# Patient Record
Sex: Female | Born: 1960
Health system: Southern US, Community
[De-identification: ages and names within clinical notes are randomized; demographics above are authoritative.]

## PROBLEM LIST (undated history)

## (undated) DIAGNOSIS — N952 Postmenopausal atrophic vaginitis: Secondary | ICD-10-CM

## (undated) DIAGNOSIS — N950 Postmenopausal bleeding: Secondary | ICD-10-CM

## (undated) DIAGNOSIS — I10 Essential (primary) hypertension: Secondary | ICD-10-CM

## (undated) DIAGNOSIS — G43909 Migraine, unspecified, not intractable, without status migrainosus: Secondary | ICD-10-CM

## (undated) DIAGNOSIS — T7840XA Allergy, unspecified, initial encounter: Secondary | ICD-10-CM

## (undated) HISTORY — DX: Postmenopausal bleeding: N95.0

## (undated) HISTORY — PX: BREAST EXCISIONAL BIOPSY: SUR124

## (undated) HISTORY — PX: TUBAL LIGATION: SHX77

## (undated) HISTORY — DX: Essential (primary) hypertension: I10

## (undated) HISTORY — DX: Postmenopausal atrophic vaginitis: N95.2

## (undated) HISTORY — DX: Migraine, unspecified, not intractable, without status migrainosus: G43.909

## (undated) HISTORY — DX: Allergy, unspecified, initial encounter: T78.40XA

## (undated) HISTORY — PX: LASIK: SHX215

---

## 1985-01-07 HISTORY — PX: BREAST BIOPSY: SHX20

## 2009-11-07 LAB — HM COLONOSCOPY

## 2012-01-08 DIAGNOSIS — N950 Postmenopausal bleeding: Secondary | ICD-10-CM

## 2012-01-08 HISTORY — PX: DILATION AND CURETTAGE OF UTERUS: SHX78

## 2012-01-08 HISTORY — DX: Postmenopausal bleeding: N95.0

## 2014-03-04 LAB — HM MAMMOGRAPHY: HM MAMMO: NORMAL (ref 0–4)

## 2014-05-20 LAB — HM PAP SMEAR: HM PAP: NORMAL

## 2014-08-25 ENCOUNTER — Ambulatory Visit (INDEPENDENT_AMBULATORY_CARE_PROVIDER_SITE_OTHER): Payer: BLUE CROSS/BLUE SHIELD | Admitting: Primary Care

## 2014-08-25 ENCOUNTER — Encounter: Payer: Self-pay | Admitting: Primary Care

## 2014-08-25 VITALS — BP 124/74 | HR 59 | Temp 97.5°F | Ht 66.0 in | Wt 214.8 lb

## 2014-08-25 DIAGNOSIS — G43109 Migraine with aura, not intractable, without status migrainosus: Secondary | ICD-10-CM | POA: Diagnosis not present

## 2014-08-25 DIAGNOSIS — I10 Essential (primary) hypertension: Secondary | ICD-10-CM

## 2014-08-25 DIAGNOSIS — M722 Plantar fascial fibromatosis: Secondary | ICD-10-CM | POA: Diagnosis not present

## 2014-08-25 DIAGNOSIS — G43909 Migraine, unspecified, not intractable, without status migrainosus: Secondary | ICD-10-CM | POA: Insufficient documentation

## 2014-08-25 NOTE — Assessment & Plan Note (Signed)
Improved over the years. Aura with seeing black spots and left arm numbness. Controlled with ibuprofen 800 mg. Will continue to monitor

## 2014-08-25 NOTE — Progress Notes (Signed)
Pre visit review using our clinic review tool, if applicable. No additional management support is needed unless otherwise documented below in the visit note. 

## 2014-08-25 NOTE — Assessment & Plan Note (Signed)
Managed on atenolol 75 mg from prior PCP. Not taken HCTZ for 2 years. BP stable today. No symptoms of chest pain, headaches, shortness of breath. Follow up in 6 months for re-evaluation.

## 2014-08-25 NOTE — Patient Instructions (Signed)
It is important that you improve your diet. Please limit carbohydrates in the form of white bread, rice, pasta, cakes, cookies, sugary drinks, etc. Increase your consumption of fresh fruits and vegetables. Be sure to drink plenty of water daily.  Start exercising if possible. You should be getting about 45 minutes of moderate intensity exercise 4-5 days weekly.  Send me a message on My Chart when you need refills.  Follow up in 6 months for re-evaluation of blood pressure.  It was a pleasure to see you today! Please don't hesitate to call me with any questions. Welcome to [facility]!

## 2014-08-25 NOTE — Assessment & Plan Note (Signed)
Long standing history. Discussed comfortable shoes, insoles. Will continue to monitor.

## 2014-08-25 NOTE — Progress Notes (Signed)
Subjective:    Patient ID: Meagan Johnson, female    DOB: [birth year], 54 y.o.   MRN: [id number]  HPI  Meagan Johnson is a 54 year old female who presents today to establish care and discuss the problems mentioned below. Will review old records. Last physical was May 2016. She had mammogram and pap completed in May 2016 with basic lab work. Lab work unremarkable.  1) Essential hypertension: Currently managed on HCTZ 12.5 mg and atenolol 75 mg but has not taken her HCTZ for over 2 years.  Her BP is stable in the office today. Denies headaches, chest pain, shortness of breath, dizziness, blurred vision.  2) Foot pain: History of plantar faciatitis. Some days she will have difficulty standing on her feet after waking in the morning. She will roll her feet with a bar at home. She does not have insoles in her shoes and plans to purchase new pair of tennis shoes.   3) Migraines: Once occurred often, not currently bothersome. Will now get an aura of seeing spots and left arm numbness, but does not experience terrible pain. She will take an ibuprofen 800 mg which will control her headache.  Review of Systems  Constitutional: Negative for unexpected weight change.  HENT: Negative for rhinorrhea.   Respiratory: Negative for cough and shortness of breath.   Cardiovascular: Negative for chest pain.  Gastrointestinal: Negative for diarrhea and constipation.  Genitourinary: Negative for difficulty urinating.  Musculoskeletal:       Chronic low back pain  Skin: Negative for rash.  Allergic/Immunologic: Positive for environmental allergies.  Neurological: Negative for dizziness, numbness and headaches.  Psychiatric/Behavioral:       Denies concerns for anxiety or depression       Past Medical History  Diagnosis Date  . Allergy   . Hypertension   . Migraine     Social History   Social History  . Marital Status: Married    Spouse Name: N/A  . Number of Children: N/A  . Years of Education:  N/A   Occupational History  . Not on file.   Social History Main Topics  . Smoking status: Never Smoker   . Smokeless tobacco: Not on file  . Alcohol Use: 0.0 oz/week    0 Standard drinks or equivalent per week  . Drug Use: Not on file  . Sexual Activity: Not on file   Other Topics Concern  . Not on file   Social History Narrative  . No narrative on file    Past Surgical History  Procedure Laterality Date  . Breast biopsy Left 1987    Family History  Problem Relation Age of Onset  . Alcohol abuse Mother   . Diabetes Mother   . Alcohol abuse Father   . Diabetes Father   . Diabetes Sister   . Drug abuse Brother   . Hypertension Brother   . Diabetes Brother   . Heart disease Maternal Grandmother   . Diabetes Maternal Grandmother   . Heart disease Maternal Grandfather   . Diabetes Maternal Grandfather   . Heart disease Paternal Grandmother   . Diabetes Paternal Grandmother   . Heart disease Paternal Grandfather   . Diabetes Paternal Grandfather     No Known Allergies  No current outpatient prescriptions on file prior to visit.   No current facility-administered medications on file prior to visit.    BP 124/74 mmHg  Pulse 59  Temp(Src) 97.5 F (36.4 C) (Oral)  Ht 5\' 6"  (1.676  m)  Wt 214 lb 12.8 oz (97.433 kg)  BMI 34.69 kg/m2  SpO2 95%    Objective:   Physical Exam  Constitutional: She is oriented to person, place, and time. She appears well-nourished.  Cardiovascular: Normal rate and regular rhythm.   Pulmonary/Chest: Effort normal and breath sounds normal.  Neurological: She is alert and oriented to person, place, and time.  Skin: Skin is warm and dry.          Assessment & Plan:

## 2014-09-04 ENCOUNTER — Encounter: Payer: Self-pay | Admitting: Primary Care

## 2014-09-04 ENCOUNTER — Other Ambulatory Visit: Payer: Self-pay | Admitting: Primary Care

## 2014-09-04 DIAGNOSIS — M722 Plantar fascial fibromatosis: Secondary | ICD-10-CM

## 2014-09-04 DIAGNOSIS — I10 Essential (primary) hypertension: Secondary | ICD-10-CM

## 2014-09-04 MED ORDER — IBUPROFEN 800 MG PO TABS: 800.0000 mg | ORAL_TABLET | Freq: Two times a day (BID) | ORAL | Status: DC | PRN

## 2014-09-04 MED ORDER — ATENOLOL 25 MG PO TABS: 75.0000 mg | ORAL_TABLET | Freq: Every day | ORAL | Status: DC

## 2014-09-08 ENCOUNTER — Ambulatory Visit (INDEPENDENT_AMBULATORY_CARE_PROVIDER_SITE_OTHER): Payer: BLUE CROSS/BLUE SHIELD | Admitting: Family Medicine

## 2014-09-08 ENCOUNTER — Encounter: Payer: Self-pay | Admitting: Family Medicine

## 2014-09-08 VITALS — BP 138/82 | HR 57 | Temp 97.8°F | Ht 66.0 in | Wt 211.2 lb

## 2014-09-08 DIAGNOSIS — M722 Plantar fascial fibromatosis: Secondary | ICD-10-CM | POA: Diagnosis not present

## 2014-09-08 NOTE — Progress Notes (Signed)
Pre visit review using our clinic review tool, if applicable. No additional management support is needed unless otherwise documented below in the visit note. 

## 2014-09-08 NOTE — Patient Instructions (Signed)
Please read handouts on Plantar Fascitis.  STRETCHING and Strengthening program critically important.  Strengthening on foot and calf muscles as seen in handout. Calf raises, 2 legged, then 1 legged. Foot massage with tennis ball. Ice massage.  NEEDS TO BE DONE EVERY DAY  Recommended over the counter insoles. (Spenco or Hapad)  A rigid shoe with good arch support helps: Dansko (great), Keen, Merrell No easily bendable shoes.    Excellent Over the Counter Orthotics:  Hapad: available at www.hapad.com (Green Sports Insoles)  SPENCO: Available at some sports stores or www.amazon.com. (I prefer the full-length green orthotic that has a yellow bottom / base)  www.pedifix.com and "[employer] [facility]" website: multiple good foot products  SHOES: Danskos, Merrells, Keens, Clarks, Birkenstocks - good arch support, want minimal bendability. Finn Comfort also excellent, but even more expensive.  Tennis Shoes / Running Shoes: Many good companies and styles. The most important thing is to get a good fit and wear a shoe that feels good in the store. Walk around in the store. Run if that is something that you can do. Some of the running stores have a treadmill, also.  Brooks, New Balance, Saucony are good, but the most important thing is to wear a shoe that fits your foot and feels good.  [facility] [facility] in [place]: excellent staff, shoe selection (Running and Athletic Shoes), OTC orthotics. On [address] across from the [employer]. For running shoe and athletic shoe fit, they are the best.  Nicer shoes:  Birkenstock shoes, [place], [place]: Excellent selection  [employer], [address] [place], [place]: They have the largest selection of comfort and supportive shoes that I have ever seen, and their staff is generally quite good in fitting you for shoes. (They will intermittently have sales, mail coupons, and you can look on their website.)  [employer] [facility]: [address], [place], [place]    

## 2014-09-08 NOTE — Progress Notes (Signed)
Dr. Karleen Hampshire T. Lynnelle Mesmer, MD, CAQ Sports Medicine [facility] [address] [place], [address] Phone: [phone] Fax: [phone]  09/08/2014  Patient: Meagan Johnson, MRN: [id number], DOB: 1960/04/09, 54 y.o.  Primary Physician:  Morrie Sheldon, NP  Chief Complaint: plantar facsiitis  Subjective:   This 54 y.o. female patient presents with a 1 year long history of heel pain. This is notable for worsening pain first thing in the morning when arising and standing after sitting.   Has had plantar fascitis for more than a year. Left is the worst. Cannot stand. Will go onto its tiptoes.   Roller Heel cups  Prior foot or ankle fractures: none Prior operations: none Orthotics or bracing: few otc braces and heel cups Medications: nsaids help PT or home rehab: HEP Night splints: minimal help Ice massage: no Ball massage: +  Metatarsal pain: no  The PMH, PSH, Social History, Family History, Medications, and allergies have been reviewed in [facility], and have been updated if relevant.  Patient Active Problem List   Diagnosis Date Noted  . Essential hypertension 08/25/2014  . Migraines 08/25/2014  . Plantar fasciitis 08/25/2014    Past Medical History  Diagnosis Date  . Allergy   . Hypertension   . Migraine     Past Surgical History  Procedure Laterality Date  . Breast biopsy Left 1987    Social History   Social History  . Marital Status: Married    Spouse Name: N/A  . Number of Children: N/A  . Years of Education: N/A   Occupational History  . Not on file.   Social History Main Topics  . Smoking status: Never Smoker   . Smokeless tobacco: Never Used  . Alcohol Use: 0.0 oz/week    0 Standard drinks or equivalent per week  . Drug Use: No  . Sexual Activity: Not on file   Other Topics Concern  . Not on file   Social History Narrative    Family History  Problem Relation Age of Onset  . Alcohol abuse Mother   .  Diabetes Mother   . Alcohol abuse Father   . Diabetes Father   . Diabetes Sister   . Drug abuse Brother   . Hypertension Brother   . Diabetes Brother   . Heart disease Maternal Grandmother   . Diabetes Maternal Grandmother   . Heart disease Maternal Grandfather   . Diabetes Maternal Grandfather   . Heart disease Paternal Grandmother   . Diabetes Paternal Grandmother   . Heart disease Paternal Grandfather   . Diabetes Paternal Grandfather     No Known Allergies  Medication list reviewed and updated in full in [facility].  GEN: No fevers, chills. Nontoxic. Primarily MSK c/o today. MSK: Detailed in the HPI GI: tolerating PO intake without difficulty Neuro: No numbness, parasthesias, or tingling associated. Otherwise the pertinent positives of the ROS are noted above.   Objective:   Blood pressure 138/82, pulse 57, temperature 97.8 F (36.6 C), temperature source Oral, height 5\' 6"  (1.676 m), weight 211 lb 4 oz (95.822 kg).  GEN: Well-developed,well-nourished,in no acute distress; alert,appropriate and cooperative throughout examination HEENT: Normocephalic and atraumatic without obvious abnormalities. Ears, externally no deformities PULM: Breathing comfortably in no respiratory distress EXT: No clubbing, cyanosis, or edema PSYCH: Normally interactive. Cooperative during the interview. Pleasant. Friendly and conversant. Not anxious or depressed appearing. Normal, full affect.  B feet Echymosis: no Edema: no ROM: full LE B Gait: heel  toe, non-antalgic MT pain: no Callus pattern: none Lateral Mall: NT Medial Mall: NT Talus: NT Navicular: NT Calcaneous: NT Metatarsals: NT 5th MT: NT Phalanges: NT Achilles: NT Plantar Fascia: tender, medial along PF. Pain with forced dorsi Fat Pad: NT Peroneals: NT Post Tib: NT Great Toe: Nml motion Ant Drawer: neg Other foot breakdown: none Long arch: preserved Transverse arch: preserved Hindfoot breakdown:  none Sensation: intact  Assessment and Plan:   Plantar fasciitis  >25 minutes spent in face to face time with patient, >50% spent in counselling or coordination of care  Anatomy reviewed. Stretching and rehab are critically important to the treatment of PF. Reviewed footwear. Rigid soles have been shown to help with PF.  Reviewed rehab of stretching and calf raises.  Reviewed rehab from [employer]  Could benefit from a corticosteroid injection if conservative treatment fails.  Add arch binders Custom crafted poron heel cushions / heel cups Discussed footwear  Refer to the patient instructions sections for details of plan shared with patient.   Patient Instructions  Please read handouts on Plantar Fascitis.  STRETCHING and Strengthening program critically important.  Strengthening on foot and calf muscles as seen in handout. Calf raises, 2 legged, then 1 legged. Foot massage with tennis ball. Ice massage.  NEEDS TO BE DONE EVERY DAY  Recommended over the counter insoles. (Spenco or Hapad)  A rigid shoe with good arch support helps: Dansko (great), Randel Pigg, Merrell No easily bendable shoes.    ____________________________________________________________________________________  Excellent Over the Counter Orthotics:  Hapad: available at www.hapad.com (Green Sports Insoles)  SPENCO: Available at some sports stores or GoalForum.com.au. (I prefer the full-length green orthotic that has a yellow bottom / base)  www.pedifix.com and [employer]" website: multiple good foot products  SHOES: Danskos, Merrells, Keens, Clarks, Birkenstocks - good arch support, want minimal bendability. Finn Comfort also excellent, but even more expensive.  Tennis Shoes / Running Shoes: Many good companies and styles. The most important thing is to get a good fit and wear a shoe that feels good in the store. Walk around in the store. Run if that is something  that you can do. Some of the running stores have a treadmill, also.  Brooks, New Balance, Andrena Mews are good, but the most important thing is to wear a shoe that fits your foot and feels good.  [facility] [facility] in [place]: excellent staff, Armed forces technical officer (Running and [employer]), OTC orthotics. On [address] across from the [employer]. For running shoe and athletic shoe fit, they are the best.  Nicer shoes:  Birkenstock shoes, [employer], [place]: Excellent selection  [facility], [address] [place], [place]: They have the largest selection of comfort and supportive shoes that I have ever seen, and their staff is generally quite good in fitting you for shoes. (They will intermittently have sales, mail coupons, and you can look on their website.)  [facility]: [address] [place], [place]       Signed,  [place] T. Kaidence Callaway, MD   Patient's Medications  New Prescriptions   No medications on file  Previous Medications   ATENOLOL (TENORMIN) 25 MG TABLET    Take 3 tablets (75 mg total) by mouth daily.   IBUPROFEN (ADVIL,MOTRIN) 800 MG TABLET    Take 1 tablet (800 mg total) by mouth 2 (two) times daily as needed for moderate pain.  Modified Medications   No medications on file  Discontinued  Medications   HYDROCHLOROTHIAZIDE (HYDRODIURIL) 25 MG TABLET    Take 25 mg by mouth daily.

## 2014-10-21 ENCOUNTER — Other Ambulatory Visit: Payer: Self-pay | Admitting: Primary Care

## 2014-10-24 NOTE — Telephone Encounter (Signed)
Electronically refill request for   ibuprofen (ADVIL,MOTRIN) 800 MG tablet   Take 1 tablet (800 mg total) by mouth 2 (two) times daily as needed for moderate pain.  Dispense: 60 tablet   Refills: 1     Last prescribed on  09/04/2014. Last seen on 08/25/2014. No future appointment.

## 2014-12-16 ENCOUNTER — Other Ambulatory Visit: Payer: Self-pay | Admitting: Primary Care

## 2014-12-16 DIAGNOSIS — I10 Essential (primary) hypertension: Secondary | ICD-10-CM

## 2014-12-16 NOTE — Telephone Encounter (Signed)
Electronically refill request for   atenolol (TENORMIN) 25 MG tablet   Take 3 tablets (75 mg total) by mouth daily.  Dispense: 90 tablet   Refills: 3     Last prescribed on 09/04/2014. Last seen on 08/25/2014. No future appointment.

## 2015-03-06 ENCOUNTER — Encounter: Payer: Self-pay | Admitting: Primary Care

## 2015-03-07 ENCOUNTER — Other Ambulatory Visit: Payer: Self-pay | Admitting: Primary Care

## 2015-03-07 DIAGNOSIS — R102 Pelvic and perineal pain: Secondary | ICD-10-CM

## 2015-03-08 ENCOUNTER — Other Ambulatory Visit: Payer: Self-pay | Admitting: Primary Care

## 2015-03-08 ENCOUNTER — Ambulatory Visit (INDEPENDENT_AMBULATORY_CARE_PROVIDER_SITE_OTHER): Payer: BLUE CROSS/BLUE SHIELD | Admitting: Obstetrics and Gynecology

## 2015-03-08 ENCOUNTER — Encounter: Payer: Self-pay | Admitting: Obstetrics and Gynecology

## 2015-03-08 VITALS — BP 114/70 | HR 79 | Ht 66.0 in | Wt 210.8 lb

## 2015-03-08 DIAGNOSIS — N3289 Other specified disorders of bladder: Secondary | ICD-10-CM

## 2015-03-08 DIAGNOSIS — R319 Hematuria, unspecified: Secondary | ICD-10-CM

## 2015-03-08 DIAGNOSIS — N8110 Cystocele, unspecified: Secondary | ICD-10-CM | POA: Diagnosis not present

## 2015-03-08 DIAGNOSIS — R638 Other symptoms and signs concerning food and fluid intake: Secondary | ICD-10-CM | POA: Insufficient documentation

## 2015-03-08 DIAGNOSIS — N952 Postmenopausal atrophic vaginitis: Secondary | ICD-10-CM | POA: Insufficient documentation

## 2015-03-08 DIAGNOSIS — R350 Frequency of micturition: Secondary | ICD-10-CM | POA: Diagnosis not present

## 2015-03-08 DIAGNOSIS — N390 Urinary tract infection, site not specified: Secondary | ICD-10-CM | POA: Insufficient documentation

## 2015-03-08 DIAGNOSIS — R102 Pelvic and perineal pain: Secondary | ICD-10-CM

## 2015-03-08 DIAGNOSIS — IMO0002 Reserved for concepts with insufficient information to code with codable children: Secondary | ICD-10-CM | POA: Insufficient documentation

## 2015-03-08 LAB — POCT URINALYSIS DIPSTICK
Bilirubin, UA: NEGATIVE
Glucose, UA: NEGATIVE
Ketones, UA: NEGATIVE
NITRITE UA: NEGATIVE
PH UA: 6
PROTEIN UA: NEGATIVE
Spec Grav, UA: 1.01
Urobilinogen, UA: 0.2

## 2015-03-08 MED ORDER — OXYBUTYNIN CHLORIDE 5 MG PO TABS: 5.0000 mg | ORAL_TABLET | Freq: Two times a day (BID) | ORAL | Status: DC

## 2015-03-08 MED ORDER — ESTRADIOL 0.1 MG/GM VA CREA: 1.0000 | TOPICAL_CREAM | Freq: Every day | VAGINAL | Status: DC

## 2015-03-08 NOTE — Patient Instructions (Signed)
1. Use estrogen cream 1/2 g nightly for 30 days; then twice a week 2. Start oxybutynin 5 mg twice a day for unstable bladder symptoms 3. Kegel exercises may be done to help prevent stress incontinence 4. Return in 6 weeks for follow-up
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
## 2015-03-09 ENCOUNTER — Emergency Department
Admission: EM | Admit: 2015-03-09 | Discharge: 2015-03-09 | Disposition: A | Discharge status: Home / Self Care | Payer: BLUE CROSS/BLUE SHIELD | Attending: Emergency Medicine | Admitting: Emergency Medicine

## 2015-03-09 DIAGNOSIS — I10 Essential (primary) hypertension: Secondary | ICD-10-CM | POA: Diagnosis not present

## 2015-03-09 DIAGNOSIS — Z79899 Other long term (current) drug therapy: Secondary | ICD-10-CM | POA: Diagnosis not present

## 2015-03-09 DIAGNOSIS — J0101 Acute recurrent maxillary sinusitis: Secondary | ICD-10-CM | POA: Insufficient documentation

## 2015-03-09 DIAGNOSIS — R05 Cough: Secondary | ICD-10-CM | POA: Diagnosis present

## 2015-03-09 MED ORDER — CETIRIZINE HCL 10 MG PO TABS: 10.0000 mg | ORAL_TABLET | Freq: Every day | ORAL | Status: DC

## 2015-03-09 MED ORDER — AMOXICILLIN-POT CLAVULANATE 875-125 MG PO TABS: 1.0000 | ORAL_TABLET | Freq: Two times a day (BID) | ORAL | Status: DC

## 2015-03-09 MED ORDER — FLUTICASONE PROPIONATE 50 MCG/ACT NA SUSP: 1.0000 | Freq: Two times a day (BID) | NASAL | Status: DC

## 2015-03-09 NOTE — ED Notes (Signed)
Pt in with cough, congestion, and earaches for 1 week.

## 2015-03-09 NOTE — ED Provider Notes (Signed)
[facility] Emergency Department Provider Note  ____________________________________________  Time seen: Approximately 11:07 PM  I have reviewed the triage vital signs and the nursing notes.   HISTORY  Chief Complaint Cough    HPI Meagan Johnson is a 55 y.o. female who presents to emergency department complaining of nasal congestion, sinus pressure, earaches, and cough 1 week. Patient states that symptoms drastically increased over the last 24 hours. Patient has a history of allergic rhinitis and states that she has frequent sinus infections. She states that this feels like a sinus infection. Patient denies any fevers or chills, headache, visual acuity changes, neck pain, chest pain, shortness of breath, abdominal pain, nausea or vomiting.   Past Medical History  Diagnosis Date  . Allergy   . Hypertension   . Migraine     Patient Active Problem List   Diagnosis Date Noted  . Cystocele 03/08/2015  . Increased BMI 03/08/2015  . Vaginal atrophy 03/08/2015  . Urinary frequency 03/08/2015  . Unstable bladder 03/08/2015  . Essential hypertension 08/25/2014  . Migraines 08/25/2014  . Plantar fasciitis 08/25/2014    Past Surgical History  Procedure Laterality Date  . Breast biopsy Left 1987  . Dilation and curettage of uterus      Current Outpatient Rx  Name  Route  Sig  Dispense  Refill  . amoxicillin-clavulanate (AUGMENTIN) 875-125 MG tablet   Oral   Take 1 tablet by mouth 2 (two) times daily.   14 tablet   0   . atenolol (TENORMIN) 25 MG tablet      TAKE 3 TABLETS (75 MG TOTAL) BY MOUTH DAILY.   90 tablet   5     CYCLE FILL MEDICATION. Authorization is required f ...   . cetirizine (ZYRTEC) 10 MG tablet   Oral   Take 1 tablet (10 mg total) by mouth daily.   30 tablet   0   . estradiol (ESTRACE VAGINAL) 0.1 MG/GM vaginal cream   Vaginal   Place 1 Applicatorful vaginally at bedtime.   42.5 g   12   . fluticasone (FLONASE) 50  MCG/ACT nasal spray   Each Nare   Place 1 spray into both nostrils 2 (two) times daily.   16 g   0   . ibuprofen (ADVIL,MOTRIN) 800 MG tablet      TAKE 1 TABLET (800 MG TOTAL) BY MOUTH 2 (TWO) TIMES DAILY AS NEEDED FOR MODERATE PAIN.   60 tablet   0     No refills available   . oxybutynin (DITROPAN) 5 MG tablet   Oral   Take 1 tablet (5 mg total) by mouth 2 (two) times daily.   60 tablet   6     Allergies Review of patient's allergies indicates no known allergies.  Family History  Problem Relation Age of Onset  . Alcohol abuse Mother   . Diabetes Mother   . Alcohol abuse Father   . Diabetes Father   . Diabetes Sister   . Drug abuse Brother   . Hypertension Brother   . Diabetes Brother   . Heart disease Maternal Grandmother   . Diabetes Maternal Grandmother   . Heart disease Maternal Grandfather   . Diabetes Maternal Grandfather   . Heart disease Paternal Grandmother   . Diabetes Paternal Grandmother   . Heart disease Paternal Grandfather   . Diabetes Paternal Grandfather   . Cancer Neg Hx     Social History Social History  Substance Use Topics  .  Smoking status: Never Smoker   . Smokeless tobacco: Never Used  . Alcohol Use: No     Review of Systems  Constitutional: No fever/chills Eyes: No visual changes. No discharge ENT: No sore throat. Positive for nasal congestion. Positive for sinus pressure. Cardiovascular: no chest pain. Respiratory: Positive for cough. No SOB. Skin: Negative for rash. Neurological: Negative for headaches, focal weakness or numbness. 10-point ROS otherwise negative.  ____________________________________________   PHYSICAL EXAM:  VITAL SIGNS: ED Triage Vitals  Enc Vitals Group     BP 03/09/15 2210 138/72 mmHg     Pulse Rate 03/09/15 2210 109     Resp 03/09/15 2210 22     Temp 03/09/15 2210 99.1 F (37.3 C)     Temp Source 03/09/15 2210 Oral     SpO2 03/09/15 2210 97 %     Weight 03/09/15 2210 210 lb (95.255 kg)      Height 03/09/15 2210  (1.676 m)     Head Cir --      Peak Flow --      Pain Score 03/09/15 2216 10     Pain Loc --      Pain Edu? --      Excl. in GC? --      Constitutional: Alert and oriented. Well appearing and in no acute distress. Eyes: Conjunctivae are normal. PERRL. EOMI. Head: Atraumatic. ENT:      Ears: EACs and TMs are unremarkable bilaterally.      Nose: Moderate purulent congestion/rhinnorhea. Turbinates are boggy and erythematous. Tenderness to palpation over the maxillary sinuses with percussion      Mouth/Throat: Mucous membranes are moist.  Neck: No stridor.   Hematological/Lymphatic/Immunilogical: Diffuse anterior cervical lymphadenopathy. Cardiovascular: Normal rate, regular rhythm. Normal S1 and S2.  Good peripheral circulation. Respiratory: Normal respiratory effort without tachypnea or retractions. Lungs CTAB. Neurologic:  Normal speech and language. No gross focal neurologic deficits are appreciated.  Skin:  Skin is warm, dry and intact. No rash noted. Psychiatric: Mood and affect are normal. Speech and behavior are normal. Patient exhibits appropriate insight and judgement.   ____________________________________________   LABS (all labs ordered are listed, but only abnormal results are displayed)  Labs Reviewed - No data to display ____________________________________________  EKG   ____________________________________________  RADIOLOGY   No results found.  ____________________________________________    PROCEDURES  Procedure(s) performed:       Medications - No data to display   ____________________________________________   INITIAL IMPRESSION / ASSESSMENT AND PLAN / ED COURSE  Pertinent labs & imaging results that were available during my care of the patient were reviewed by me and considered in my medical decision making (see chart for details).  Patient's diagnosis is consistent with acute maxillary sinusitis. This  is recurrent in nature. Patient will be prescribed antibiotics, Flonase and Zyrtec. Patient will follow-up with primary care provider or ENT provider if necessary. Patient is given ED precautions to return to the ED for any worsening or new symptoms.     ____________________________________________  FINAL CLINICAL IMPRESSION(S) / ED DIAGNOSES  Final diagnoses:  Acute recurrent maxillary sinusitis      NEW MEDICATIONS STARTED DURING THIS VISIT:  New Prescriptions   AMOXICILLIN-CLAVULANATE (AUGMENTIN) 875-125 MG TABLET    Take 1 tablet by mouth 2 (two) times daily.   CETIRIZINE (ZYRTEC) 10 MG TABLET    Take 1 tablet (10 mg total) by mouth daily.   FLUTICASONE (FLONASE) 50 MCG/ACT NASAL SPRAY    Place 1 spray  into both nostrils 2 (two) times daily.        Delorise Royals Cuthriell, PA-C 03/09/15 2319  Loleta Rose, MD 03/10/15 [phone]

## 2015-03-09 NOTE — Discharge Instructions (Signed)
Sinusitis, Adult  Sinusitis is redness, soreness, and inflammation of the paranasal sinuses. Paranasal sinuses are air pockets within the bones of your face. They are located beneath your eyes, in the middle of your forehead, and above your eyes. In healthy paranasal sinuses, mucus is able to drain out, and air is able to circulate through them by way of your nose. However, when your paranasal sinuses are inflamed, mucus and air can become trapped. This can allow bacteria and other germs to grow and cause infection.  Sinusitis can develop quickly and last only a short time (acute) or continue over a long period (chronic). Sinusitis that lasts for more than 12 weeks is considered chronic.  CAUSES  Causes of sinusitis include:  · Allergies.  · Structural abnormalities, such as displacement of the cartilage that separates your nostrils (deviated septum), which can decrease the air flow through your nose and sinuses and affect sinus drainage.  · Functional abnormalities, such as when the small hairs (cilia) that line your sinuses and help remove mucus do not work properly or are not present.  SIGNS AND SYMPTOMS  Symptoms of acute and chronic sinusitis are the same. The primary symptoms are pain and pressure around the affected sinuses. Other symptoms include:  · Upper toothache.  · Earache.  · Headache.  · Bad breath.  · Decreased sense of smell and taste.  · A cough, which worsens when you are lying flat.  · Fatigue.  · Fever.  · Thick drainage from your nose, which often is green and may contain pus (purulent).  · Swelling and warmth over the affected sinuses.  DIAGNOSIS  Your health care provider will perform a physical exam. During your exam, your health care provider may perform any of the following to help determine if you have acute sinusitis or chronic sinusitis:  · Look in your nose for signs of abnormal growths in your nostrils (nasal polyps).  · Tap over the affected sinus to check for signs of  infection.  · View the inside of your sinuses using an imaging device that has a light attached (endoscope).  If your health care provider suspects that you have chronic sinusitis, one or more of the following tests may be recommended:  · Allergy tests.  · Nasal culture. A sample of mucus is taken from your nose, sent to a lab, and screened for bacteria.  · Nasal cytology. A sample of mucus is taken from your nose and examined by your health care provider to determine if your sinusitis is related to an allergy.  TREATMENT  Most cases of acute sinusitis are related to a viral infection and will resolve on their own within 10 days. Sometimes, medicines are prescribed to help relieve symptoms of both acute and chronic sinusitis. These may include pain medicines, decongestants, nasal steroid sprays, or saline sprays.  However, for sinusitis related to a bacterial infection, your health care provider will prescribe antibiotic medicines. These are medicines that will help kill the bacteria causing the infection.  Rarely, sinusitis is caused by a fungal infection. In these cases, your health care provider will prescribe antifungal medicine.  For some cases of chronic sinusitis, surgery is needed. Generally, these are cases in which sinusitis recurs more than 3 times per year, despite other treatments.  HOME CARE INSTRUCTIONS  · Drink plenty of water. Water helps thin the mucus so your sinuses can drain more easily.  · Use a humidifier.  · Inhale steam 3-4 times a day (for   example, sit in the bathroom with the shower running).  · Apply a warm, moist washcloth to your face 3-4 times a day, or as directed by your health care provider.  · Use saline nasal sprays to help moisten and clean your sinuses.  · Take medicines only as directed by your health care provider.  · If you were prescribed either an antibiotic or antifungal medicine, finish it all even if you start to feel better.  SEEK IMMEDIATE MEDICAL CARE IF:  · You have  increasing pain or severe headaches.  · You have nausea, vomiting, or drowsiness.  · You have swelling around your face.  · You have vision problems.  · You have a stiff neck.  · You have difficulty breathing.     This information is not intended to replace advice given to you by your health care provider. Make sure you discuss any questions you have with your health care provider.     Document Released: 12/24/2004 Document Revised: 01/14/2014 Document Reviewed: 01/08/2011  Elsevier Interactive Patient Education ©2016 [employer]  Sinus Rinse  WHAT IS A SINUS RINSE?  A sinus rinse is a simple home treatment that is used to rinse your sinuses with a sterile mixture of salt and water (saline solution). Sinuses are air-filled spaces in your skull behind the bones of your face and forehead that open into your nasal cavity.  You will use the following:  · Saline solution.  · Neti pot or spray bottle. This releases the saline solution into your nose and through your sinuses. Neti pots and spray bottles can be purchased at your local pharmacy, a health food store, or online.  WHEN WOULD I DO A SINUS RINSE?  A sinus rinse can help to clear mucus, dirt, dust, or pollen from the nasal cavity. You may do a sinus rinse when you have a cold, a virus, nasal allergy symptoms, a sinus infection, or stuffiness in the nose or sinuses.  If you are considering a sinus rinse:  · Ask your child's health care provider before performing a sinus rinse on your child.  · Do not do a sinus rinse if you have had ear or nasal surgery, ear infection, or blocked ears.  HOW DO I DO A SINUS RINSE?  · Wash your hands.  · Disinfect your device according to the directions provided and then dry it.  · Use the solution that comes with your device or one that is sold separately in stores. Follow the mixing directions on the package.  · Fill your device with the amount of saline solution as directed by the device instructions.  · Stand over a sink and  tilt your head sideways over the sink.  · Place the spout of the device in your upper nostril (the one closer to the ceiling).  · Gently pour or squeeze the saline solution into the nasal cavity. The liquid should drain to the lower nostril if you are not overly congested.  · Gently blow your nose. Blowing too hard may cause ear pain.  · Repeat in the other nostril.  · Clean and rinse your device with clean water and then air-dry it.  ARE THERE RISKS OF A SINUS RINSE?   Sinus rinse is generally very safe and effective. However, there are a few risks, which include:   · A burning sensation in the sinuses. This may happen if you do not make the saline solution as directed. Make sure to follow all directions when   making the saline solution.  · Infection from contaminated water. This is rare, but possible.  · Nasal irritation.     This information is not intended to replace advice given to you by your health care provider. Make sure you discuss any questions you have with your health care provider.     Document Released: 07/21/2013 Document Reviewed: 07/21/2013  Elsevier Interactive Patient Education ©2016 Elsevier Inc[employer]

## 2015-03-10 LAB — URINE CULTURE

## 2015-03-15 ENCOUNTER — Other Ambulatory Visit: Payer: BLUE CROSS/BLUE SHIELD

## 2015-03-15 ENCOUNTER — Other Ambulatory Visit: Payer: Self-pay

## 2015-03-15 MED ORDER — ESTROGENS, CONJUGATED 0.625 MG/GM VA CREA: 1.0000 | TOPICAL_CREAM | Freq: Every day | VAGINAL | Status: DC

## 2015-03-15 NOTE — Telephone Encounter (Signed)
Pt aware per Meagan Johnson fax insurance will cover premarin- erx to [facility]-

## 2015-03-17 ENCOUNTER — Other Ambulatory Visit: Payer: Self-pay | Admitting: Primary Care

## 2015-03-17 DIAGNOSIS — Z0000 Encounter for general adult medical examination without abnormal findings: Secondary | ICD-10-CM

## 2015-03-17 DIAGNOSIS — I10 Essential (primary) hypertension: Secondary | ICD-10-CM

## 2015-03-22 ENCOUNTER — Other Ambulatory Visit (INDEPENDENT_AMBULATORY_CARE_PROVIDER_SITE_OTHER): Payer: BLUE CROSS/BLUE SHIELD

## 2015-03-22 DIAGNOSIS — I10 Essential (primary) hypertension: Secondary | ICD-10-CM | POA: Diagnosis not present

## 2015-03-22 DIAGNOSIS — Z0000 Encounter for general adult medical examination without abnormal findings: Secondary | ICD-10-CM | POA: Diagnosis not present

## 2015-03-22 LAB — LIPID PANEL
CHOL/HDL RATIO: 5
Cholesterol: 182 mg/dL (ref 0–200)
HDL: 39.2 mg/dL (ref >39.00)
LDL CALC: 124 mg/dL — AB (ref 0–99)
NONHDL: 142.41
TRIGLYCERIDES: 90 mg/dL (ref 0.0–149.0)
VLDL: 18 mg/dL (ref 0.0–40.0)

## 2015-03-22 LAB — VITAMIN D 25 HYDROXY (VIT D DEFICIENCY, FRACTURES): VITD: 20.18 ng/mL — AB (ref 30.00–100.00)

## 2015-03-22 LAB — HEMOGLOBIN A1C: Hgb A1c MFr Bld: 6 % (ref 4.6–6.5)

## 2015-03-22 LAB — COMPREHENSIVE METABOLIC PANEL
ALK PHOS: 75 U/L (ref 39–117)
ALT: 18 U/L (ref 0–35)
AST: 19 U/L (ref 0–37)
Albumin: 4.3 g/dL (ref 3.5–5.2)
BILIRUBIN TOTAL: 0.6 mg/dL (ref 0.2–1.2)
BUN: 11 mg/dL (ref 6–23)
CO2: 29 meq/L (ref 19–32)
CREATININE: 0.77 mg/dL (ref 0.40–1.20)
Calcium: 9.5 mg/dL (ref 8.4–10.5)
Chloride: 105 mEq/L (ref 96–112)
GFR: 82.3 mL/min (ref >60.00)
GLUCOSE: 94 mg/dL (ref 70–99)
Potassium: 4 mEq/L (ref 3.5–5.1)
SODIUM: 139 meq/L (ref 135–145)
TOTAL PROTEIN: 7.6 g/dL (ref 6.0–8.3)

## 2015-03-22 LAB — CBC
HEMATOCRIT: 44.1 % (ref 36.0–46.0)
HEMOGLOBIN: 14.7 g/dL (ref 12.0–15.0)
MCHC: 33.3 g/dL (ref 30.0–36.0)
MCV: 89.3 fl (ref 78.0–100.0)
PLATELETS: 264 10*3/uL (ref 150.0–400.0)
RBC: 4.94 Mil/uL (ref 3.87–5.11)
RDW: 14.2 % (ref 11.5–15.5)
WBC: 10.4 10*3/uL (ref 4.0–10.5)

## 2015-03-22 LAB — TSH: TSH: 2.43 u[IU]/mL (ref 0.35–4.50)

## 2015-03-29 ENCOUNTER — Other Ambulatory Visit: Payer: Self-pay | Admitting: *Deleted

## 2015-03-29 ENCOUNTER — Encounter: Payer: Self-pay | Admitting: Primary Care

## 2015-03-29 ENCOUNTER — Ambulatory Visit (INDEPENDENT_AMBULATORY_CARE_PROVIDER_SITE_OTHER): Payer: BLUE CROSS/BLUE SHIELD | Admitting: Primary Care

## 2015-03-29 VITALS — BP 118/78 | HR 68 | Temp 97.7°F | Wt 214.5 lb

## 2015-03-29 DIAGNOSIS — J309 Allergic rhinitis, unspecified: Secondary | ICD-10-CM | POA: Diagnosis not present

## 2015-03-29 DIAGNOSIS — I10 Essential (primary) hypertension: Secondary | ICD-10-CM

## 2015-03-29 DIAGNOSIS — Z0000 Encounter for general adult medical examination without abnormal findings: Secondary | ICD-10-CM

## 2015-03-29 DIAGNOSIS — R638 Other symptoms and signs concerning food and fluid intake: Secondary | ICD-10-CM

## 2015-03-29 DIAGNOSIS — N8110 Cystocele, unspecified: Secondary | ICD-10-CM

## 2015-03-29 DIAGNOSIS — R7303 Prediabetes: Secondary | ICD-10-CM | POA: Insufficient documentation

## 2015-03-29 DIAGNOSIS — E559 Vitamin D deficiency, unspecified: Secondary | ICD-10-CM | POA: Diagnosis not present

## 2015-03-29 DIAGNOSIS — IMO0002 Reserved for concepts with insufficient information to code with codable children: Secondary | ICD-10-CM

## 2015-03-29 DIAGNOSIS — M545 Low back pain, unspecified: Secondary | ICD-10-CM | POA: Insufficient documentation

## 2015-03-29 MED ORDER — CETIRIZINE HCL 10 MG PO TABS: 10.0000 mg | ORAL_TABLET | Freq: Every day | ORAL | Status: DC

## 2015-03-29 MED ORDER — FLUTICASONE PROPIONATE 50 MCG/ACT NA SUSP: 1.0000 | Freq: Two times a day (BID) | NASAL | Status: DC

## 2015-03-29 MED ORDER — VITAMIN D (ERGOCALCIFEROL) 1.25 MG (50000 UNIT) PO CAPS: ORAL_CAPSULE | ORAL | Status: DC

## 2015-03-29 NOTE — Assessment & Plan Note (Signed)
Refilled zyrtec and Flonase.

## 2015-03-29 NOTE — Assessment & Plan Note (Signed)
Poor diet, no exercise. Cares for her husband who has Lewy Body Dementia and Alzheimers. He is total care. She is going to restart the Paleo diet and try to get some exercise. Will continue to monitor.

## 2015-03-29 NOTE — Progress Notes (Signed)
Pre visit review using our clinic review tool, if applicable. No additional management support is needed unless otherwise documented below in the visit note. 

## 2015-03-29 NOTE — Progress Notes (Signed)
Subjective:    Patient ID: Meagan Johnson, female    DOB: [birth year], 55 y.o.   MRN: [id number]  HPI  Meagan Johnson is a 55 year old female who presents today for complete physical.  Immunizations: -Tetanus: Unsure, believes it's been within 5 years.  -Influenza: Did not receive last season.   Diet: She endorses a fair diet. She was doing the [employer] for a while until 3 weeks ago when she experienced more stress taking care of her husband. Her diet currently consists of:  Breakfast: Eggs, vegetables, coffee Lunch: Skips, or Salad with protein Dinner: Protein, vegetable, fat Snacks: Over the past 3 weeks junk food. Desserts: None Beverages: Coffee, water  Exercise: She does not currently exercise. Eye exam: Completed March 2016  Dental exam: Completed in June 2016 Colonoscopy: Completed at 68, normal. Pap Smear: Completed in 2016 Mammogram: Completed in 2016   Review of Systems  Constitutional: Negative for unexpected weight change.  HENT: Negative for rhinorrhea.   Respiratory: Negative for cough and shortness of breath.   Cardiovascular: Negative for chest pain.  Gastrointestinal: Negative for diarrhea and constipation.  Genitourinary:       Following with GYN for cystocele. Due again in April 2017.  Musculoskeletal: Positive for back pain.  Skin: Negative for rash.       Numerous spots removed from arms and back. History of Basil Cell Carcinoma.  Allergic/Immunologic: Positive for environmental allergies.  Neurological: Positive for headaches. Negative for dizziness.       Occasional numbness to left lower back.   Psychiatric/Behavioral: Negative for suicidal ideas.       Feeling overwhelmed. Now in counseling to help with support for her husband with Lewy Body Dementia.        Past Medical History  Diagnosis Date  . Allergy   . Hypertension   . Migraine     Social History   Social History  . Marital Status: Married    Spouse Name: N/A  . Number  of Children: N/A  . Years of Education: N/A   Occupational History  . Not on file.   Social History Main Topics  . Smoking status: Never Smoker   . Smokeless tobacco: Never Used  . Alcohol Use: No  . Drug Use: No  . Sexual Activity: Not Currently    Birth Control/ Protection: Post-menopausal   Other Topics Concern  . Not on file   Social History Narrative    Past Surgical History  Procedure Laterality Date  . Breast biopsy Left 1987  . Dilation and curettage of uterus      Family History  Problem Relation Age of Onset  . Alcohol abuse Mother   . Diabetes Mother   . Alcohol abuse Father   . Diabetes Father   . Diabetes Sister   . Drug abuse Brother   . Hypertension Brother   . Diabetes Brother   . Heart disease Maternal Grandmother   . Diabetes Maternal Grandmother   . Heart disease Maternal Grandfather   . Diabetes Maternal Grandfather   . Heart disease Paternal Grandmother   . Diabetes Paternal Grandmother   . Heart disease Paternal Grandfather   . Diabetes Paternal Grandfather   . Cancer Neg Hx     No Known Allergies  Current Outpatient Prescriptions on File Prior to Visit  Medication Sig Dispense Refill  . atenolol (TENORMIN) 25 MG tablet TAKE 3 TABLETS (75 MG TOTAL) BY MOUTH DAILY. 90 tablet 5  . conjugated estrogens (PREMARIN)  vaginal cream Place 1 Applicatorful vaginally daily. 1/2 gram nightly x 30 days- then 1/2 gram twice weekly 42.5 g 12  . ibuprofen (ADVIL,MOTRIN) 800 MG tablet TAKE 1 TABLET (800 MG TOTAL) BY MOUTH 2 (TWO) TIMES DAILY AS NEEDED FOR MODERATE PAIN. 60 tablet 0  . oxybutynin (DITROPAN) 5 MG tablet Take 1 tablet (5 mg total) by mouth 2 (two) times daily. (Patient not taking: Reported on 03/29/2015) 60 tablet 6   No current facility-administered medications on file prior to visit.    BP 118/78 mmHg  Pulse 68  Temp(Src) 97.7 F (36.5 C) (Oral)  Wt 214 lb 8 oz (97.297 kg)    Objective:   Physical Exam  Constitutional: She is  oriented to person, place, and time. She appears well-nourished.  HENT:  Right Ear: Tympanic membrane and ear canal normal.  Left Ear: Tympanic membrane and ear canal normal.  Nose: Nose normal.  Mouth/Throat: Oropharynx is clear and moist.  Eyes: Conjunctivae and EOM are normal. Pupils are equal, round, and reactive to light.  Neck: Neck supple. No thyromegaly present.  Cardiovascular: Normal rate and regular rhythm.   No murmur heard. Pulmonary/Chest: Effort normal and breath sounds normal. She has no rales.  Abdominal: Soft. Bowel sounds are normal. There is no tenderness.  Musculoskeletal: Normal range of motion.  Lymphadenopathy:    She has no cervical adenopathy.  Neurological: She is alert and oriented to person, place, and time. She has normal reflexes. No cranial nerve deficit.  Skin: Skin is warm and dry. No rash noted.  Psychiatric: She has a normal mood and affect.          Assessment & Plan:

## 2015-03-29 NOTE — Assessment & Plan Note (Signed)
Stable today.  Continue same.

## 2015-03-29 NOTE — Assessment & Plan Note (Signed)
Following with GYN currently. Follow up visit in April 2017.

## 2015-03-29 NOTE — Assessment & Plan Note (Signed)
A1C of 6.0 on recent labs. She is working on improving her diet and will start exercising when possible. Will recheck labs in 3 months.

## 2015-03-29 NOTE — Assessment & Plan Note (Signed)
Td, pap, mammogram, colonoscopy UTD. Exam unremarkable. Labs with borderline diabetes, vitamin d def. Discussed the importance of a healthy diet and regular exercise in order for weight loss and to reduce risk of other medical diseases.  Follow up in 1 year for repeat physical.

## 2015-03-29 NOTE — Assessment & Plan Note (Signed)
Level of 20 today. Will start her on 50,000 units once weekly for 12 weeks. Recheck labs in 3 months.

## 2015-03-29 NOTE — Assessment & Plan Note (Signed)
Present to left lower back. Frequently lifts her 6'3" husband to change his depends. Discussed to stop this, and will have her look into a lift. Exam unremarkable. Negative straight leg raise.

## 2015-03-29 NOTE — Patient Instructions (Signed)
Start Vitamin D capsules for vitamin D deficiency. Take 1 capsule by mouth once weekly for a total of 12 weeks.  Schedule a lab only appointment in 3 months for recheck of vitamin D levels and borderline diabetes.  Continue to work on reducing processed foods, sweets, junk food.  Start exercising. You should be getting 1 hour of moderate intensity exercise 5 days weekly.  Follow up in 1 year for repeat physical or sooner if needed.  It was a pleasure to see you today! E-mail me if you need ANYTHING!

## 2015-04-06 ENCOUNTER — Encounter: Payer: Self-pay | Admitting: Primary Care

## 2015-04-19 ENCOUNTER — Ambulatory Visit (INDEPENDENT_AMBULATORY_CARE_PROVIDER_SITE_OTHER): Payer: BLUE CROSS/BLUE SHIELD | Admitting: Obstetrics and Gynecology

## 2015-04-19 ENCOUNTER — Encounter: Payer: Self-pay | Admitting: Obstetrics and Gynecology

## 2015-04-19 VITALS — BP 116/63 | HR 59 | Ht 66.0 in | Wt 211.9 lb

## 2015-04-19 DIAGNOSIS — N952 Postmenopausal atrophic vaginitis: Secondary | ICD-10-CM

## 2015-04-19 DIAGNOSIS — R638 Other symptoms and signs concerning food and fluid intake: Secondary | ICD-10-CM

## 2015-04-19 DIAGNOSIS — IMO0002 Reserved for concepts with insufficient information to code with codable children: Secondary | ICD-10-CM

## 2015-04-19 DIAGNOSIS — N8110 Cystocele, unspecified: Secondary | ICD-10-CM | POA: Diagnosis not present

## 2015-04-19 DIAGNOSIS — N3289 Other specified disorders of bladder: Secondary | ICD-10-CM | POA: Diagnosis not present

## 2015-04-19 DIAGNOSIS — R350 Frequency of micturition: Secondary | ICD-10-CM | POA: Diagnosis not present

## 2015-04-19 NOTE — Progress Notes (Signed)
Chief complaint: 1. Unstable bladder 2. Cystocele 3. Vaginal atrophy  Patient presents for follow-up on above issues.  UNSTABLE BLADDER: Patient states that urinary frequency is less and now on the medication. She is only having to go approximately twice a night to the bathroom. She is not having any significant side effects.  CYSTOCELE: Patient reports less symptomatic bulge being present over the past 6 weeks. She is doing Kegel exercises in an attempt to help prevent stress incontinence as well as minimize the bulge during heavy lifting. Patient is not interested in a pessary at this time to help with controlling symptomatology.  VAGINAL ATROPHY: Patient has been using Premarin cream nightly for 30 days followed by twice a week for the past several weeks. OBJECTIVE: BP 116/63 mmHg  Pulse 59  Ht 5\' 6"  (1.676 m)  Wt 211 lb 14.4 oz (96.117 kg)  BMI 34.22 kg/m2 Pleasant white female in no acute distress Abdomen: Soft, nontender Pelvic exam: External genitalia-mild hyperemia labia majora; small 1 cm x 1 cm plaque like rash in right groin BUS-normal Vagina-fair estrogen effect Cervix-no lesions Uterus-midplane, normal size and shape, mobile, nontender Adnexa-nonpalpable nontender Rectovaginal-normal external exam  ASSESSMENT: 1. Unstable bladder, with less significant symptoms since starting on oxybutynin twice a day 2. Cystocele, less symptomatic using Premarin vaginal cream and performing Kegel exercises 3. Vaginal atrophy, improving with estrogen cream therapy  PLAN: 1. Continue with oxybutynin 5 mg twice a day 2. Continue with Kegel exercises 3. Continue using Premarin cream 1/2 g intravaginal twice a week 4. Return in 6 months for follow-up  A total of 15 minutes were spent face-to-face with the patient during this encounter and over half of that time dealt with counseling and coordination of care.  Herold HarmsMartin A Terris Germano, MD  Note: This dictation was prepared with Dragon  dictation along with smaller phrase technology. Any transcriptional errors that result from this process are unintentional.

## 2015-04-19 NOTE — Patient Instructions (Addendum)
1. Continue with oxybutynin 5 mg twice a day 2. Continue with Kegel exercises. 3. Continue using Premarin cream 1/2 g intravaginal twice a week 4. Return in 6 months for follow-up

## 2015-04-24 ENCOUNTER — Ambulatory Visit (INDEPENDENT_AMBULATORY_CARE_PROVIDER_SITE_OTHER): Payer: BLUE CROSS/BLUE SHIELD | Admitting: Primary Care

## 2015-04-24 ENCOUNTER — Encounter: Payer: Self-pay | Admitting: Primary Care

## 2015-04-24 VITALS — BP 124/80 | HR 82 | Temp 98.0°F | Ht 66.0 in | Wt 210.8 lb

## 2015-04-24 DIAGNOSIS — J209 Acute bronchitis, unspecified: Secondary | ICD-10-CM

## 2015-04-24 MED ORDER — AZITHROMYCIN 250 MG PO TABS: ORAL_TABLET | ORAL | Status: DC

## 2015-04-24 NOTE — Progress Notes (Signed)
Subjective:    Patient ID: Meagan Johnson, female    DOB: 07-May-1960, 55 y.o.   MRN: [id number]  HPI  Meagan Johnson is a 55 year old female who presents today with a chief complaint of cough. She also reports fevers, sore throat, congestion, fatigue. Her symptoms began Friday April 14th.   She was evaluated at [facility] on Sunday 04/16. She tested negative for strep and was provided with treatment for a bacterial conjunctivitis to her right eye. She was provided with a prescription for albuterol inhaler and diagnosed with viral bronchitis. She's taken ibuprofen this morning for a fever of 102.3. Her fevers have been consistently in the 102 range for the last 2 days. Her cough is productive with green sputum. She was around her grandson who was recently treated for a bacterial bronchitis.  She's also taken her Zyrtec and Flonase daily as she typically does.   Review of Systems  Constitutional: Positive for fever, chills and fatigue.  HENT: Positive for congestion and sore throat. Negative for postnasal drip.   Respiratory: Positive for cough and chest tightness. Negative for shortness of breath.   Cardiovascular: Negative for chest pain.  Musculoskeletal: Negative for myalgias.       Past Medical History  Diagnosis Date  . Allergy   . Hypertension   . Migraine   . Postmenopausal bleeding 2014     Social History   Social History  . Marital Status: Married    Spouse Name: N/A  . Number of Children: N/A  . Years of Education: N/A   Occupational History  . Not on file.   Social History Main Topics  . Smoking status: Never Smoker   . Smokeless tobacco: Never Used  . Alcohol Use: No  . Drug Use: No  . Sexual Activity: Not Currently    Birth Control/ Protection: Post-menopausal   Other Topics Concern  . Not on file   Social History Narrative    Past Surgical History  Procedure Laterality Date  . Breast biopsy Left 1987  . Dilation and curettage of  uterus  2014  . Lasik Bilateral     Family History  Problem Relation Age of Onset  . Alcohol abuse Mother   . Diabetes Mother   . Alcohol abuse Father   . Diabetes Father   . Diabetes Sister   . Drug abuse Brother   . Hypertension Brother   . Diabetes Brother   . Heart disease Maternal Grandmother   . Diabetes Maternal Grandmother   . Heart disease Maternal Grandfather   . Diabetes Maternal Grandfather   . Heart disease Paternal Grandmother   . Diabetes Paternal Grandmother   . Heart disease Paternal Grandfather   . Diabetes Paternal Grandfather   . Cancer Neg Hx     No Known Allergies  Current Outpatient Prescriptions on File Prior to Visit  Medication Sig Dispense Refill  . atenolol (TENORMIN) 25 MG tablet TAKE 3 TABLETS (75 MG TOTAL) BY MOUTH DAILY. 90 tablet 5  . cetirizine (ZYRTEC) 10 MG tablet Take 1 tablet (10 mg total) by mouth daily. 90 tablet 3  . conjugated estrogens (PREMARIN) vaginal cream Place 1 Applicatorful vaginally daily. 1/2 gram nightly x 30 days- then 1/2 gram twice weekly 42.5 g 12  . fluticasone (FLONASE) 50 MCG/ACT nasal spray Place 1 spray into both nostrils 2 (two) times daily. 16 g 11  . ibuprofen (ADVIL,MOTRIN) 800 MG tablet TAKE 1 TABLET (800 MG TOTAL) BY MOUTH 2 (  TWO) TIMES DAILY AS NEEDED FOR MODERATE PAIN. 60 tablet 0  . oxybutynin (DITROPAN) 5 MG tablet Take 1 tablet (5 mg total) by mouth 2 (two) times daily. 60 tablet 6  . Vitamin D, Ergocalciferol, (DRISDOL) 50000 units CAPS capsule Take 1 capsule by mouth once weekly for 12 weeks. 12 capsule 0   No current facility-administered medications on file prior to visit.    BP 124/80 mmHg  Pulse 82  Temp(Src) 98 F (36.7 C) (Oral)  Ht 5\' 6"  (1.676 m)  Wt 210 lb 12.8 oz (95.618 kg)  BMI 34.04 kg/m2  SpO2 97%    Objective:   Physical Exam  Constitutional: She appears well-nourished. She appears ill.  HENT:  Right Ear: Ear canal normal.  Left Ear: Ear canal normal.  Nose: Right sinus  exhibits maxillary sinus tenderness. Right sinus exhibits no frontal sinus tenderness. Left sinus exhibits maxillary sinus tenderness. Left sinus exhibits no frontal sinus tenderness.  Mouth/Throat: Posterior oropharyngeal erythema present. No oropharyngeal exudate or posterior oropharyngeal edema.  Dullness to bilateral TM's  Cardiovascular: Normal rate and regular rhythm.   Pulmonary/Chest: Effort normal. She has rales.  Difficult to auscultate, do not sound clear. No wheezing.  Lymphadenopathy:    She has cervical adenopathy.  Skin: Skin is warm and dry.          Assessment & Plan:  Acute Bronchitis:  Symptoms since Friday last week with fevers of 102. Evaluated at [facility] yesterday and provided with albuterol inhaler, diagnosed with viral bronchitis. Appears ill, fever of 102 this morning, normal after motrin several hours later. Lungs with difficulty auscultating. Do not appear clear, no wheezing. Given high fevers, contact with grandson who is ill, and presentation, will treat. RX for Zpak provided. Robitussin or Delsym PRN cough, continue Zyrtec, Flonase, albuterol. Fluids, rest, follow up PRN.

## 2015-04-24 NOTE — Progress Notes (Signed)
Pre visit review using our clinic review tool, if applicable. No additional management support is needed unless otherwise documented below in the visit note. 

## 2015-04-24 NOTE — Patient Instructions (Addendum)
Start Azithromycin antibiotics. Take 2 tablets by mouth today, then 1 tablet daily for 4 additional days.  Cough: Delsym DM or Robitussin DM as needed.   Continue Zyrtec daily.   Ensure you are staying hydrated with water.  Please call me if no improvement in symptoms in 3-4 days.  It was a pleasure to see you today!  Acute Bronchitis Bronchitis is inflammation of the airways that extend from the windpipe into the lungs (bronchi). The inflammation often causes mucus to develop. This leads to a cough, which is the most common symptom of bronchitis.  In acute bronchitis, the condition usually develops suddenly and goes away over time, usually in a couple weeks. Smoking, allergies, and asthma can make bronchitis worse. Repeated episodes of bronchitis may cause further lung problems.  CAUSES Acute bronchitis is most often caused by the same virus that causes a cold. The virus can spread from person to person (contagious) through coughing, sneezing, and touching contaminated objects. SIGNS AND SYMPTOMS   Cough.   Fever.   Coughing up mucus.   Body aches.   Chest congestion.   Chills.   Shortness of breath.   Sore throat.  DIAGNOSIS  Acute bronchitis is usually diagnosed through a physical exam. Your health care provider will also ask you questions about your medical history. Tests, such as chest X-rays, are sometimes done to rule out other conditions.  TREATMENT  Acute bronchitis usually goes away in a couple weeks. Oftentimes, no medical treatment is necessary. Medicines are sometimes given for relief of fever or cough. Antibiotic medicines are usually not needed but may be prescribed in certain situations. In some cases, an inhaler may be recommended to help reduce shortness of breath and control the cough. A cool mist vaporizer may also be used to help thin bronchial secretions and make it easier to clear the chest.  HOME CARE INSTRUCTIONS  Get plenty of rest.   Drink  enough fluids to keep your urine clear or pale yellow (unless you have a medical condition that requires fluid restriction). Increasing fluids may help thin your respiratory secretions (sputum) and reduce chest congestion, and it will prevent dehydration.   Take medicines only as directed by your health care provider.  If you were prescribed an antibiotic medicine, finish it all even if you start to feel better.  Avoid smoking and secondhand smoke. Exposure to cigarette smoke or irritating chemicals will make bronchitis worse. If you are a smoker, consider using nicotine gum or skin patches to help control withdrawal symptoms. Quitting smoking will help your lungs heal faster.   Reduce the chances of another bout of acute bronchitis by washing your hands frequently, avoiding people with cold symptoms, and trying not to touch your hands to your mouth, nose, or eyes.   Keep all follow-up visits as directed by your health care provider.  SEEK MEDICAL CARE IF: Your symptoms do not improve after 1 week of treatment.  SEEK IMMEDIATE MEDICAL CARE IF:  You develop an increased fever or chills.   You have chest pain.   You have severe shortness of breath.  You have bloody sputum.   You develop dehydration.  You faint or repeatedly feel like you are going to pass out.  You develop repeated vomiting.  You develop a severe headache. MAKE SURE YOU:   Understand these instructions.  Will watch your condition.  Will get help right away if you are not doing well or get worse.   This information is not  intended to replace advice given to you by your health care provider. Make sure you discuss any questions you have with your health care provider.   Document Released: 02/01/2004 Document Revised: 01/14/2014 Document Reviewed: 06/16/2012 Elsevier Interactive Patient Education [employer].

## 2015-05-01 ENCOUNTER — Encounter: Payer: Self-pay | Admitting: Primary Care

## 2015-06-27 ENCOUNTER — Other Ambulatory Visit (INDEPENDENT_AMBULATORY_CARE_PROVIDER_SITE_OTHER): Payer: BLUE CROSS/BLUE SHIELD

## 2015-06-27 DIAGNOSIS — R7303 Prediabetes: Secondary | ICD-10-CM | POA: Diagnosis not present

## 2015-06-27 DIAGNOSIS — E559 Vitamin D deficiency, unspecified: Secondary | ICD-10-CM

## 2015-06-27 LAB — VITAMIN D 25 HYDROXY (VIT D DEFICIENCY, FRACTURES): VITD: 41.12 ng/mL (ref 30.00–100.00)

## 2015-06-27 LAB — HEMOGLOBIN A1C: Hgb A1c MFr Bld: 5.6 % (ref 4.6–6.5)

## 2015-08-18 ENCOUNTER — Telehealth: Payer: BLUE CROSS/BLUE SHIELD | Admitting: Family Medicine

## 2015-08-18 DIAGNOSIS — J0120 Acute ethmoidal sinusitis, unspecified: Secondary | ICD-10-CM

## 2015-08-18 MED ORDER — AMOXICILLIN-POT CLAVULANATE 875-125 MG PO TABS: 1.0000 | ORAL_TABLET | Freq: Two times a day (BID) | ORAL | 0 refills | Status: DC

## 2015-08-18 NOTE — Progress Notes (Signed)
We are sorry that you are not feeling well.  Here is how we plan to help!  Based on what you have shared with me it looks like you have sinusitis.  Sinusitis is inflammation and infection in the sinus cavities of the head.  Based on your presentation I believe you most likely have Acute Viral Sinusitis.This is an infection most likely caused by a virus. There is not specific treatment for viral sinusitis other than to help you with the symptoms until the infection runs its course.  You may use an oral decongestant such as Mucinex D or if you have glaucoma or high blood pressure use plain Mucinex. Saline nasal spray help and can safely be used as often as needed for congestion, I have prescribed: Amoxicillin/Clav/ increase daily fluids.   Some authorities believe that zinc sprays or the use of Echinacea may shorten the course of your symptoms.  Sinus infections are not as easily transmitted as other respiratory infection, however we still recommend that you avoid close contact with loved ones, especially the very young and elderly.  Remember to wash your hands thoroughly throughout the day as this is the number one way to prevent the spread of infection!  Home Care:  Only take medications as instructed by your medical team.  Complete the entire course of an antibiotic.  Do not take these medications with alcohol.  A steam or ultrasonic humidifier can help congestion.  You can place a towel over your head and breathe in the steam from hot water coming from a faucet.  Avoid close contacts especially the very young and the elderly.  Cover your mouth when you cough or sneeze.  Always remember to wash your hands.  Get Help Right Away If:  You develop worsening fever or sinus pain.  You develop a severe head ache or visual changes.  Your symptoms persist after you have completed your treatment plan.  Make sure you  Understand these instructions.  Will watch your condition.  Will get  help right away if you are not doing well or get worse.  Your e-visit answers were reviewed by a board certified advanced clinical practitioner to complete your personal care plan.  Depending on the condition, your plan could have included both over the counter or prescription medications.  If there is a problem please reply  once you have received a response from your provider.  Your safety is important to us.  If you have drug allergies check your prescription carefully.    You can use MyChart to ask questions about today's visit, request a non-urgent call back, or ask for a work or school excuse for 24 hours related to this e-Visit. If it has been greater than 24 hours you will need to follow up with your provider, or enter a new e-Visit to address those concerns.  You will get an e-mail in the next two days asking about your experience.  I hope that your e-visit has been valuable and will speed your recovery. Thank you for using e-visits.

## 2015-08-28 ENCOUNTER — Encounter: Payer: Self-pay | Admitting: Primary Care

## 2015-08-28 ENCOUNTER — Ambulatory Visit (INDEPENDENT_AMBULATORY_CARE_PROVIDER_SITE_OTHER): Payer: BLUE CROSS/BLUE SHIELD | Admitting: Primary Care

## 2015-08-28 ENCOUNTER — Ambulatory Visit (INDEPENDENT_AMBULATORY_CARE_PROVIDER_SITE_OTHER)
Admission: RE | Admit: 2015-08-28 | Discharge: 2015-08-28 | Disposition: A | Discharge status: Home / Self Care | Payer: BLUE CROSS/BLUE SHIELD | Source: Ambulatory Visit | Attending: Primary Care | Admitting: Primary Care

## 2015-08-28 VITALS — BP 122/74 | HR 65 | Temp 98.0°F | Ht 66.0 in | Wt 216.8 lb

## 2015-08-28 DIAGNOSIS — R059 Cough, unspecified: Secondary | ICD-10-CM

## 2015-08-28 DIAGNOSIS — R05 Cough: Secondary | ICD-10-CM

## 2015-08-28 MED ORDER — PREDNISONE 10 MG PO TABS: ORAL_TABLET | ORAL | 0 refills | Status: DC

## 2015-08-28 MED ORDER — BENZONATATE 200 MG PO CAPS: 200.0000 mg | ORAL_CAPSULE | Freq: Three times a day (TID) | ORAL | 0 refills | Status: DC | PRN

## 2015-08-28 NOTE — Patient Instructions (Signed)
Complete xray(s) prior to leaving today.   You may take Benzonatate capsules for cough. Take 1 capsule by mouth three times daily as needed for cough.  Start prednisone tablets. Take three tablets for 2 days, then two tablets for 2 days, then one tablet for 2 days.  I will be in touch with you later today! It was a pleasure to see you today!

## 2015-08-28 NOTE — Progress Notes (Signed)
Subjective:    Patient ID: Meagan Johnson, female    DOB: 03-Feb-1960, 55 y.o.   MRN: [id number]  HPI  Meagan Johnson is a 55 year old female who presents today with a chief complaint of cough. She was recently evaluated and treated for an acute bacterial sinusitis on 08/18/15 through e-visit. She was provided with a prescription for Augmentin and encouraged to increase consumption of fluids.   Since her e-visit she's continued to experience her cough, sinus pressure, sore throat. She completed the Augmentin antibiotics this morning. She's had no improvement with her prior symptoms. She's also taking Tussin DM without improvement. Her cough is worse during both day and evening hours and is non productive. She's used the albuterol inhaler without improvement and causes her to feel "jittery".  Denies fevers, esophageal reflux, throat fullness, epigastric pain. She's also not taken her Zyrtec in several months.  Review of Systems  Constitutional: Positive for fatigue. Negative for chills and fever.  HENT: Positive for congestion, postnasal drip, sinus pressure and sore throat.   Respiratory: Positive for cough and shortness of breath. Negative for wheezing.   Cardiovascular: Negative for chest pain.       Past Medical History:  Diagnosis Date  . Allergy   . Hypertension   . Migraine   . Postmenopausal bleeding 2014     Social History   Social History  . Marital status: Married    Spouse name: N/A  . Number of children: N/A  . Years of education: N/A   Occupational History  . Not on file.   Social History Main Topics  . Smoking status: Never Smoker  . Smokeless tobacco: Never Used  . Alcohol use No  . Drug use: No  . Sexual activity: Not Currently    Birth control/ protection: Post-menopausal   Other Topics Concern  . Not on file   Social History Narrative  . No narrative on file    Past Surgical History:  Procedure Laterality Date  . BREAST BIOPSY Left 1987  .  DILATION AND CURETTAGE OF UTERUS  2014  . LASIK Bilateral     Family History  Problem Relation Age of Onset  . Alcohol abuse Mother   . Diabetes Mother   . Alcohol abuse Father   . Diabetes Father   . Diabetes Sister   . Drug abuse Brother   . Hypertension Brother   . Diabetes Brother   . Heart disease Maternal Grandmother   . Diabetes Maternal Grandmother   . Heart disease Maternal Grandfather   . Diabetes Maternal Grandfather   . Heart disease Paternal Grandmother   . Diabetes Paternal Grandmother   . Heart disease Paternal Grandfather   . Diabetes Paternal Grandfather   . Cancer Neg Hx     No Known Allergies  Current Outpatient Prescriptions on File Prior to Visit  Medication Sig Dispense Refill  . atenolol (TENORMIN) 25 MG tablet TAKE 3 TABLETS (75 MG TOTAL) BY MOUTH DAILY. 90 tablet 5  . cetirizine (ZYRTEC) 10 MG tablet Take 1 tablet (10 mg total) by mouth daily. 90 tablet 3  . conjugated estrogens (PREMARIN) vaginal cream Place 1 Applicatorful vaginally daily. 1/2 gram nightly x 30 days- then 1/2 gram twice weekly 42.5 g 12  . fluticasone (FLONASE) 50 MCG/ACT nasal spray Place 1 spray into both nostrils 2 (two) times daily. 16 g 11  . ibuprofen (ADVIL,MOTRIN) 800 MG tablet TAKE 1 TABLET (800 MG TOTAL) BY MOUTH 2 (TWO) TIMES DAILY AS  NEEDED FOR MODERATE PAIN. 60 tablet 0  . neomycin-polymyxin b-dexamethasone (MAXITROL) 3.5-10000-0.1 SUSP Place 2 drops into both eyes every 4 (four) hours.   0  . PROAIR HFA 108 (90 Base) MCG/ACT inhaler 1-2 puffs every 6 (six) hours as needed.   0   No current facility-administered medications on file prior to visit.     BP 122/74   Pulse 65   Temp 98 F (36.7 C) (Oral)   Ht 5\' 6"  (1.676 m)   Wt 216 lb 12.8 oz (98.3 kg)   SpO2 96%   BMI 34.99 kg/m    Objective:   Physical Exam  Constitutional: She appears well-nourished. She does not appear ill.  HENT:  Right Ear: Tympanic membrane and ear canal normal.  Left Ear: Tympanic  membrane and ear canal normal.  Nose: Right sinus exhibits no maxillary sinus tenderness and no frontal sinus tenderness. Left sinus exhibits no maxillary sinus tenderness and no frontal sinus tenderness.  Mouth/Throat: Oropharynx is clear and moist.  Eyes: Conjunctivae are normal.  Neck: Neck supple.  Cardiovascular: Normal rate and regular rhythm.   Pulmonary/Chest: Effort normal. She has no decreased breath sounds. She has no wheezes. She has rales in the left upper field and the left lower field.  Dry cough noted  Lymphadenopathy:    She has no cervical adenopathy.  Skin: Skin is warm and dry.          Assessment & Plan:  Cough:  Present for 3+ weeks. Recently treated with Augmentin 10 day course for acute sinusitis. No improvement in symptoms. Exam today with questionable lung sounds, tightness to airway, no wheezing. Will have her restart Zyrtec. Obtain chest xray today to rule out resistant pneumonia.  Rx for Tessalon Pearls and Prednisone taper for cough, chest tightness, sinus pressure. Fluids, rest. Will await xray results.  Morrie Sheldonlark,Meagan Eddington Kendal, NP

## 2015-08-28 NOTE — Progress Notes (Signed)
Pre visit review using our clinic review tool, if applicable. No additional management support is needed unless otherwise documented below in the visit note. 

## 2015-09-04 ENCOUNTER — Other Ambulatory Visit: Payer: Self-pay | Admitting: Primary Care

## 2015-09-04 DIAGNOSIS — I10 Essential (primary) hypertension: Secondary | ICD-10-CM

## 2015-09-20 ENCOUNTER — Encounter: Payer: Self-pay | Admitting: Primary Care

## 2015-09-27 ENCOUNTER — Ambulatory Visit (INDEPENDENT_AMBULATORY_CARE_PROVIDER_SITE_OTHER): Payer: BLUE CROSS/BLUE SHIELD | Admitting: Primary Care

## 2015-09-27 ENCOUNTER — Encounter: Payer: Self-pay | Admitting: Primary Care

## 2015-09-27 VITALS — BP 122/76 | HR 94 | Temp 97.4°F | Ht 66.0 in | Wt 216.4 lb

## 2015-09-27 DIAGNOSIS — F418 Other specified anxiety disorders: Secondary | ICD-10-CM

## 2015-09-27 DIAGNOSIS — F419 Anxiety disorder, unspecified: Principal | ICD-10-CM

## 2015-09-27 DIAGNOSIS — F329 Major depressive disorder, single episode, unspecified: Secondary | ICD-10-CM

## 2015-09-27 MED ORDER — SERTRALINE HCL 50 MG PO TABS: 50.0000 mg | ORAL_TABLET | Freq: Every day | ORAL | 1 refills | Status: DC

## 2015-09-27 NOTE — Progress Notes (Signed)
Pre visit review using our clinic review tool, if applicable. No additional management support is needed unless otherwise documented below in the visit note. 

## 2015-09-27 NOTE — Patient Instructions (Signed)
Start Zoloft 50 mg tablets for anxiety and depression. Take 1/2 tablet by mouth once daily for 8 days, then advance to 1 full tablet thereafter.  Congestion: Try taking Mucinex DM. This will help loosen up the mucous in your chest. Ensure you take this medication with a full glass of water.  Schedule a follow up appointment in 6 weeks for re-evaluation.  It was a pleasure to see you today!

## 2015-09-27 NOTE — Progress Notes (Signed)
Subjective:    Patient ID: Meagan Johnson, female    DOB: [birth year], 55 y.o.   MRN: [id number]  HPI  Meagan Johnson is a 55 year old female who presents today to discuss symptoms of depression. 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 She is currently seeing a therapist through [facility] (since April-May 2017) who recommend she come in today to discuss possible antidepressant prescription.   She was at her support group last week and she started crying and couldn't stop. The leader of her support group recommended she be seen for further evaluation.   A song on the radio will cause tearfulness, her husband is getting worse. She's felt depressed and down for years, but over the past 1 year her symptoms have progressed. She has a family history of anxiety disorder and she experiences daily irritability, worry, anxiety. Her support group meets once monthly, and she meets with her therapist every 3-5 weeks. Denies SI/HI.  PHQ 9 score of 11 and GAD 7 score of 11 today.  Review of Systems  Respiratory: Negative for shortness of breath.   Cardiovascular: Negative for chest pain.  Psychiatric/Behavioral: Negative for sleep disturbance and suicidal ideas. The patient is nervous/anxious.        Tearfulness, depression       Past Medical History:  Diagnosis Date  . Allergy   . Hypertension   . Migraine   . Postmenopausal bleeding 2014     Social History   Social History  . Marital status: Married    Spouse name: N/A  . Number of children: N/A  . Years of education: N/A   Occupational History  . Not on file.   Social History Main Topics  . Smoking status: Never Smoker  . Smokeless tobacco: Never Used  . Alcohol use No  . Drug use: No  . Sexual activity: Not Currently    Birth control/ protection: Post-menopausal   Other Topics Concern  . Not on file   Social History Narrative  . No narrative on file      Past Surgical History:  Procedure Laterality Date  . BREAST BIOPSY Left 1987  . DILATION AND CURETTAGE OF UTERUS  2014  . LASIK Bilateral     Family History  Problem Relation Age of Onset  . Alcohol abuse Mother   . Diabetes Mother   . Alcohol abuse Father   . Diabetes Father   . Diabetes Sister   . Drug abuse Brother   . Hypertension Brother   . Diabetes Brother   . Heart disease Maternal Grandmother   . Diabetes Maternal Grandmother   . Heart disease Maternal Grandfather   . Diabetes Maternal Grandfather   . Heart disease Paternal Grandmother   . Diabetes Paternal Grandmother   . Heart disease Paternal Grandfather   . Diabetes Paternal Grandfather   . Cancer Neg Hx     No Known Allergies  Current Outpatient Prescriptions on File Prior to Visit  Medication Sig Dispense Refill  . atenolol (TENORMIN) 25 MG tablet TAKE 3 TABLETS (75 MG TOTAL) BY MOUTH DAILY. 90 tablet 5  . cetirizine (ZYRTEC) 10 MG tablet Take 1 tablet (10 mg total) by mouth daily. 90 tablet 3  . conjugated estrogens (PREMARIN) vaginal cream Place 1 Applicatorful vaginally daily. 1/2 gram nightly x 30 days- then 1/2 gram twice weekly 42.5 g 12  . fluticasone (FLONASE) 50 MCG/ACT nasal spray Place 1  spray into both nostrils 2 (two) times daily. 16 g 11  . ibuprofen (ADVIL,MOTRIN) 800 MG tablet TAKE 1 TABLET (800 MG TOTAL) BY MOUTH 2 (TWO) TIMES DAILY AS NEEDED FOR MODERATE PAIN. 60 tablet 0  . neomycin-polymyxin b-dexamethasone (MAXITROL) 3.5-10000-0.1 SUSP Place 2 drops into both eyes every 4 (four) hours.   0  . PROAIR HFA 108 (90 Base) MCG/ACT inhaler 1-2 puffs every 6 (six) hours as needed.   0   No current facility-administered medications on file prior to visit.     BP 122/76   Pulse 94   Temp 97.4 F (36.3 C) (Oral)   Ht 5\' 6"  (1.676 m)   Wt 216 lb 6.4 oz (98.2 kg)   SpO2 96%   BMI 34.93 kg/m    Objective:   Physical Exam  Constitutional: She appears well-nourished.  Cardiovascular:  Normal rate and regular rhythm.   Pulmonary/Chest: Effort normal and breath sounds normal.  Skin: Skin is warm and dry.  Psychiatric: She has a normal mood and affect.  Tearful during exam when talking about symptoms          Assessment & Plan:

## 2015-09-27 NOTE — Assessment & Plan Note (Signed)
Long history of both anxiety and depression but overall able to manage on her own. Over the past 1 year symptoms have progressed. Currently meeting with support group and therapist to both recommend vacation treatment. PHQ 9 score of 11 and GAD 7 score of 11 today. Long discussion regarding treatment options and she agrees to medication for treatment.  Prescription for Zoloft 50 mg sent to pharmacy. Patient is to take 1/2 tablet daily for 8 days, then advance to 1 full tablet thereafter. We discussed possible side effects of headache, GI upset, drowsiness, and SI/HI. If thoughts of SI/HI develop, we discussed to present to the emergency immediately. Patient verbalized understanding.   Follow-up in 6 weeks for reevaluation.

## 2015-10-18 ENCOUNTER — Encounter: Payer: Self-pay | Admitting: Obstetrics and Gynecology

## 2015-10-18 ENCOUNTER — Ambulatory Visit (INDEPENDENT_AMBULATORY_CARE_PROVIDER_SITE_OTHER): Payer: BLUE CROSS/BLUE SHIELD | Admitting: Obstetrics and Gynecology

## 2015-10-18 VITALS — BP 112/66 | HR 63 | Ht 66.0 in | Wt 212.5 lb

## 2015-10-18 DIAGNOSIS — N3289 Other specified disorders of bladder: Secondary | ICD-10-CM

## 2015-10-18 DIAGNOSIS — N8111 Cystocele, midline: Secondary | ICD-10-CM | POA: Insufficient documentation

## 2015-10-18 DIAGNOSIS — N952 Postmenopausal atrophic vaginitis: Secondary | ICD-10-CM

## 2015-10-18 NOTE — Patient Instructions (Signed)
1. Continue Premarin cream intravaginal twice weekly 2. Return in December for annual exam

## 2015-10-18 NOTE — Progress Notes (Signed)
Chief complaint: 1. Unstable bladder 2. Vaginal atrophy 3. Cystocele  Patient presents for follow-up She was started on oxybutynin for unstable bladder but has discontinued this because of fatigue symptoms. She is doing Kegel exercises and working out and feels that her prolapse is less significant.  Past medical history, past surgical history, problem list, medications, and allergie are reviewed.  OBJECTIVE: BP 112/66   Pulse 63   Ht 5\' 6"  (1.676 m)   Wt 212 lb 8 oz (96.4 kg)   BMI 34.30 kg/m  Abdomen: Soft, nontender Pelvic exam: External genitalia-mild hyperemia labia majora BUS-normal Vagina-fair estrogen effect Cervix-no lesions Uterus-midplane, normal size and shape, mobile, nontender Adnexa-nonpalpable nontender Rectovaginal-normal external exam  ASSESSMENT: 1. Unstable bladder, stable, status post discontinuation of oxybutynin 2. Cystocele, left symptomatic with Premarin vaginal cream usage 3.  Vaginal atrophy, stable  PLAN: 1. Continue with Premarin intravaginal twice a week 2. Return in December 2017 for annual exam 3. Continue with Kegel exercises  A total of 15 minutes were spent face-to-face with the patient during this encounter and over half of that time dealt with counseling and coordination of care.  Herold HarmsMartin A Defrancesco, MD  Note: This dictation was prepared with Dragon dictation along with smaller phrase technology. Any transcriptional errors that result from this process are unintentional.

## 2015-11-08 ENCOUNTER — Encounter: Payer: Self-pay | Admitting: Primary Care

## 2015-11-08 ENCOUNTER — Ambulatory Visit (INDEPENDENT_AMBULATORY_CARE_PROVIDER_SITE_OTHER): Payer: BLUE CROSS/BLUE SHIELD | Admitting: Primary Care

## 2015-11-08 DIAGNOSIS — F418 Other specified anxiety disorders: Secondary | ICD-10-CM

## 2015-11-08 DIAGNOSIS — F329 Major depressive disorder, single episode, unspecified: Secondary | ICD-10-CM

## 2015-11-08 DIAGNOSIS — F419 Anxiety disorder, unspecified: Principal | ICD-10-CM

## 2015-11-08 MED ORDER — SERTRALINE HCL 50 MG PO TABS: 50.0000 mg | ORAL_TABLET | Freq: Every day | ORAL | 1 refills | Status: DC

## 2015-11-08 NOTE — Assessment & Plan Note (Signed)
Initiated on Zoloft 50 mg last visit. Much improvement in symptoms since. Denies SI/HI. Continue current regimen.

## 2015-11-08 NOTE — Patient Instructions (Signed)
Continue Zoloft (sertraline) tablets for anxiety and depression.  Please e-mail or call me if you need anything.  Have a great trip in December to [place]! Nice to see you!

## 2015-11-08 NOTE — Progress Notes (Signed)
Pre visit review using our clinic review tool, if applicable. No additional management support is needed unless otherwise documented below in the visit note. 

## 2015-11-08 NOTE — Progress Notes (Signed)
Subjective:    Patient ID: Meagan Johnson, female    DOB: [birth year], 55 y.o.   MRN: [id number]  HPI  Meagan Johnson is a 55 year old female who presents today for follow up of anxiety and depression. She has a prior history of both for years but was able to manage symptoms on her own. Over the past 1 year her symptoms had progressed. She is the sole caregiver of her husband with end stage Alzheimer's disease. PHQ 9 score of 11 and GAD 7 score of 11 during her last visit so she was initiated on Zoloft 50 mg.  Since her last visit she's noticed improvement in tearfulness, sadness, irritability and is able to talk about her husbands illness without crying. She follows with group therapy once monthly and [facility]. She denies SI/HI, GI upset, headaches.   Review of Systems  Respiratory: Negative for shortness of breath.   Cardiovascular: Negative for chest pain.  Neurological: Negative for headaches.  Psychiatric/Behavioral: Negative for sleep disturbance and suicidal ideas. The patient is not nervous/anxious.        Feeling much improved since Zoloft       Past Medical History:  Diagnosis Date  . Allergy   . Hypertension   . Migraine   . Postmenopausal bleeding 2014  . Vaginal atrophy      Social History   Social History  . Marital status: Married    Spouse name: N/A  . Number of children: N/A  . Years of education: N/A   Occupational History  . Not on file.   Social History Main Topics  . Smoking status: Never Smoker  . Smokeless tobacco: Never Used  . Alcohol use No  . Drug use: No  . Sexual activity: Not Currently    Birth control/ protection: Post-menopausal   Other Topics Concern  . Not on file   Social History Narrative  . No narrative on file    Past Surgical History:  Procedure Laterality Date  . BREAST BIOPSY Left 1987  . DILATION AND CURETTAGE OF UTERUS  2014  . LASIK Bilateral     Family  History  Problem Relation Age of Onset  . Alcohol abuse Mother   . Diabetes Mother   . Alcohol abuse Father   . Diabetes Father   . Diabetes Sister   . Drug abuse Brother   . Hypertension Brother   . Diabetes Brother   . Heart disease Maternal Grandmother   . Diabetes Maternal Grandmother   . Heart disease Maternal Grandfather   . Diabetes Maternal Grandfather   . Heart disease Paternal Grandmother   . Diabetes Paternal Grandmother   . Heart disease Paternal Grandfather   . Diabetes Paternal Grandfather   . Cancer Neg Hx     No Known Allergies  Current Outpatient Prescriptions on File Prior to Visit  Medication Sig Dispense Refill  . atenolol (TENORMIN) 25 MG tablet TAKE 3 TABLETS (75 MG TOTAL) BY MOUTH DAILY. 90 tablet 5  . cetirizine (ZYRTEC) 10 MG tablet Take 1 tablet (10 mg total) by mouth daily. 90 tablet 3  . conjugated estrogens (PREMARIN) vaginal cream Place 1 Applicatorful vaginally daily. 1/2 gram nightly x 30 days- then 1/2 gram twice weekly 42.5 g 12  . fluticasone (FLONASE) 50 MCG/ACT nasal spray Place 1 spray into both nostrils 2 (two) times daily. 16 g 11  . ibuprofen (ADVIL,MOTRIN) 800 MG tablet TAKE 1 TABLET (800 MG  TOTAL) BY MOUTH 2 (TWO) TIMES DAILY AS NEEDED FOR MODERATE PAIN. 60 tablet 0   No current facility-administered medications on file prior to visit.     BP 122/82   Pulse (!) 54   Temp 97.5 F (36.4 C) (Oral)   Ht 5\' 6"  (1.676 m)   Wt 214 lb 12.8 oz (97.4 kg)   SpO2 98%   BMI 34.67 kg/m    Objective:   Physical Exam  Constitutional: She appears well-nourished.  Cardiovascular: Normal rate and regular rhythm.   Pulmonary/Chest: Effort normal and breath sounds normal.  Skin: Skin is warm and dry.  Psychiatric: She has a normal mood and affect.  Mood much improved since last visit. Smiling, interactive.          Assessment & Plan:

## 2015-12-18 NOTE — Progress Notes (Signed)
ANNUAL PREVENTATIVE CARE GYN  ENCOUNTER NOTE  Subjective:       Meagan Johnson is a 55 y.o. [id number] female here for a routine annual gynecologic exam.  Current complaints: 1.   None  Unstable bladder is notable for voiding 2-4 times a night. Trial of Detrol was unsuccessful due to fatigue side effects. Patient is not interested in using Myrbetriq at this time She continues to use vaginal Premarin cream twice a week. Kegel exercises are also being done. I'll function is normal.   Gynecologic History No LMP recorded. Patient is postmenopausal. Contraception: post menopausal status Last Pap: 05/2014 wnl- thru gyn in [place]. Results were: normal Last mammogram: 02/2014 birad 1. Results were: normal  Obstetric History OB History  Gravida Para Term Preterm AB Living  3 3 3     3   SAB TAB Ectopic Multiple Live Births          3    # Outcome Date GA Lbr Len/2nd Weight Sex Delivery Anes PTL Lv  3 Term 1987   9 lb 9.6 oz (4.355 kg) F Vag-Spont   LIV  2 Term 1981   8 lb 9.6 oz (3.901 kg) F Vag-Spont   LIV  1 Term 1979   7 lb 6.4 oz (3.357 kg) M Vag-Spont   LIV      Past Medical History:  Diagnosis Date  . Allergy   . Hypertension   . Migraine   . Postmenopausal bleeding 2014  . Vaginal atrophy     Past Surgical History:  Procedure Laterality Date  . BREAST BIOPSY Left 1987  . DILATION AND CURETTAGE OF UTERUS  2014  . LASIK Bilateral     Current Outpatient Prescriptions on File Prior to Visit  Medication Sig Dispense Refill  . atenolol (TENORMIN) 25 MG tablet TAKE 3 TABLETS (75 MG TOTAL) BY MOUTH DAILY. 90 tablet 5  . cetirizine (ZYRTEC) 10 MG tablet Take 1 tablet (10 mg total) by mouth daily. 90 tablet 3  . conjugated estrogens (PREMARIN) vaginal cream Place 1 Applicatorful vaginally daily. 1/2 gram nightly x 30 days- then 1/2 gram twice weekly 42.5 g 12  . fluticasone (FLONASE) 50 MCG/ACT nasal spray Place 1 spray into both nostrils 2 (two) times daily. 16 g 11  . ibuprofen  (ADVIL,MOTRIN) 800 MG tablet TAKE 1 TABLET (800 MG TOTAL) BY MOUTH 2 (TWO) TIMES DAILY AS NEEDED FOR MODERATE PAIN. 60 tablet 0  . sertraline (ZOLOFT) 50 MG tablet Take 1 tablet (50 mg total) by mouth daily. 90 tablet 1   No current facility-administered medications on file prior to visit.     No Known Allergies  Social History   Social History  . Marital status: Married    Spouse name: N/A  . Number of children: N/A  . Years of education: N/A   Occupational History  . Not on file.   Social History Main Topics  . Smoking status: Never Smoker  . Smokeless tobacco: Never Used  . Alcohol use No  . Drug use: No  . Sexual activity: Not Currently    Birth control/ protection: Post-menopausal   Other Topics Concern  . Not on file   Social History Narrative  . No narrative on file    Family History  Problem Relation Age of Onset  . Alcohol abuse Mother   . Diabetes Mother   . Alcohol abuse Father   . Diabetes Father   . Diabetes Sister   . Drug abuse Brother   .  Hypertension Brother   . Diabetes Brother   . Heart disease Maternal Grandmother   . Diabetes Maternal Grandmother   . Heart disease Maternal Grandfather   . Diabetes Maternal Grandfather   . Heart disease Paternal Grandmother   . Diabetes Paternal Grandmother   . Heart disease Paternal Grandfather   . Diabetes Paternal Grandfather   . Cancer Neg Hx     The following portions of the patient's history were reviewed and updated as appropriate: allergies, current medications, past family history, past medical history, past social history, past surgical history and problem list.  Review of Systems ROS Review of Systems - General ROS: negative for - chills, fatigue, fever, hot flashes, night sweats, weight gain or weight loss Psychological ROS: negative for - anxiety, decreased libido, depression, mood swings, physical abuse or sexual abuse Ophthalmic ROS: negative for - blurry vision, eye pain or loss of  vision ENT ROS: negative for - headaches, hearing change, visual changes or vocal changes Allergy and Immunology ROS: negative for - hives, itchy/watery eyes or seasonal allergies Hematological and Lymphatic ROS: negative for - bleeding problems, bruising, swollen lymph nodes or weight loss Endocrine ROS: negative for - galactorrhea, hair pattern changes, hot flashes, malaise/lethargy, mood swings, palpitations, polydipsia/polyuria, skin changes, temperature intolerance or unexpected weight changes Breast ROS: negative for - new or changing breast lumps or nipple discharge Respiratory ROS: negative for - cough or shortness of breath Cardiovascular ROS: negative for - chest pain, irregular heartbeat, palpitations or shortness of breath Gastrointestinal ROS: no abdominal pain, change in bowel habits, or black or bloody stools Genito-Urinary ROS: no dysuria, trouble voiding, or hematuria Musculoskeletal ROS: negative for - joint pain or joint stiffness Neurological ROS: negative for - bowel and bladder control changes Dermatological ROS: negative for rash and skin lesion changes   Objective:   BP (!) 114/58   Pulse 74   Ht 5\' 6"  (1.676 m)   Wt 218 lb 9.6 oz (99.2 kg)   BMI 35.28 kg/m  CONSTITUTIONAL: Well-developed, well-nourished female in no acute distress.  PSYCHIATRIC: Normal mood and affect. Normal behavior. Normal judgment and thought content. NEUROLGIC: Alert and oriented to person, place, and time. Normal muscle tone coordination. No cranial nerve deficit noted. HENT:  Normocephalic, atraumatic, External right and left ear normal. Oropharynx is clear and moist EYES: Conjunctivae and EOM are normal. Pupils are equal, round, and reactive to light. No scleral icterus.  NECK: Normal range of motion, supple, no masses.  Normal thyroid.  SKIN: Skin is warm and dry. No rash noted. Not diaphoretic. No erythema. No pallor. CARDIOVASCULAR: Normal heart rate noted, regular rhythm, no  murmur. RESPIRATORY: Clear to auscultation bilaterally. Effort and breath sounds normal, no problems with respiration noted. BREASTS: Symmetric in size. No masses, skin changes, nipple drainage, or lymphadenopathy. ABDOMEN: Soft, normal bowel sounds, no distention noted.  No tenderness, rebound or guarding.  BLADDER: Normal Pelvic exam: External normal BUS-normal Vagina-fair estrogen effect Cervix-no lesions Uterus-midplane, normal size and shape, mobile, nontender Adnexa-nonpalpable nontender Rectovaginal-normal external exam  MUSCULOSKELETAL: Normal range of motion. No tenderness.  No cyanosis, clubbing, or edema.  2+ distal pulses. LYMPHATIC: No Axillary, Supraclavicular, or Inguinal Adenopathy.    Assessment:   Annual gynecologic examination 56 y.o. Contraception: post menopausal status Overweight Problem List Items Addressed This Visit    Increased BMI   Vaginal atrophy   Unstable bladder   Cystocele, midline    Other Visit Diagnoses    Well woman exam with routine gynecological exam    -  Primary   Screening for breast cancer       Screening for colon cancer          Plan:  Pap: Pap Co Test Mammogram: Ordered Stool Guaiac Testing:  Ordered Labs: thru pcp Routine preventative health maintenance measures emphasized: Exercise/Diet/Weight control, Tobacco Warnings and Alcohol/Substance use risks Premarin cream intravaginal is refilled and to be used twice weekly Calcium with vitamin D supplementation is encouraged twice a Return to Clinic - 1 Year   [facility]ard[facility]rystal Miller, CMA  Herold HarmsMartin A Arwa Yero, MD  Note: This dictation was prepared with Dragon dictation along with smaller phrase technology. Any transcriptional errors that result from this process are unintentional.

## 2015-12-20 ENCOUNTER — Encounter: Payer: Self-pay | Admitting: Obstetrics and Gynecology

## 2015-12-20 ENCOUNTER — Ambulatory Visit (INDEPENDENT_AMBULATORY_CARE_PROVIDER_SITE_OTHER): Payer: BLUE CROSS/BLUE SHIELD | Admitting: Obstetrics and Gynecology

## 2015-12-20 VITALS — BP 114/58 | HR 74 | Ht 66.0 in | Wt 218.6 lb

## 2015-12-20 DIAGNOSIS — Z1239 Encounter for other screening for malignant neoplasm of breast: Secondary | ICD-10-CM

## 2015-12-20 DIAGNOSIS — Z1231 Encounter for screening mammogram for malignant neoplasm of breast: Secondary | ICD-10-CM | POA: Diagnosis not present

## 2015-12-20 DIAGNOSIS — Z1211 Encounter for screening for malignant neoplasm of colon: Secondary | ICD-10-CM

## 2015-12-20 DIAGNOSIS — N8111 Cystocele, midline: Secondary | ICD-10-CM

## 2015-12-20 DIAGNOSIS — R638 Other symptoms and signs concerning food and fluid intake: Secondary | ICD-10-CM | POA: Diagnosis not present

## 2015-12-20 DIAGNOSIS — N952 Postmenopausal atrophic vaginitis: Secondary | ICD-10-CM

## 2015-12-20 DIAGNOSIS — Z01419 Encounter for gynecological examination (general) (routine) without abnormal findings: Secondary | ICD-10-CM

## 2015-12-20 DIAGNOSIS — N3289 Other specified disorders of bladder: Secondary | ICD-10-CM | POA: Diagnosis not present

## 2015-12-20 NOTE — Patient Instructions (Signed)
1. Pap smear done 2. Mammogram ordered 3. Stool guaiac cards are given for colon cancer screening 4. Screening lab work is to be obtained through primary care 5. Calcium with vitamin D supplementation is encouraged 6. Healthy eating with exercising controlled weight loss is encouraged 7  Return in 1 year for annual exam 8. Premarin cream intravaginal twice a week is ordered    Health Maintenance for Postmenopausal Women Introduction Menopause is a normal process in which your reproductive ability comes to an end. This process happens gradually over a span of months to years, usually between the ages of 62 and 21. Menopause is complete when you have missed 12 consecutive menstrual periods. It is important to talk with your health care provider about some of the most common conditions that affect postmenopausal women, such as heart disease, cancer, and bone loss (osteoporosis). Adopting a healthy lifestyle and getting preventive care can help to promote your health and wellness. Those actions can also lower your chances of developing some of these common conditions. What should I know about menopause? During menopause, you may experience a number of symptoms, such as:  Moderate-to-severe hot flashes.  Night sweats.  Decrease in sex drive.  Mood swings.  Headaches.  Tiredness.  Irritability.  Memory problems.  Insomnia. Choosing to treat or not to treat menopausal changes is an individual decision that you make with your health care provider. What should I know about hormone replacement therapy and supplements? Hormone therapy products are effective for treating symptoms that are associated with menopause, such as hot flashes and night sweats. Hormone replacement carries certain risks, especially as you become older. If you are thinking about using estrogen or estrogen with progestin treatments, discuss the benefits and risks with your health care provider. What should I know about  heart disease and stroke? Heart disease, heart attack, and stroke become more likely as you age. This may be due, in part, to the hormonal changes that your body experiences during menopause. These can affect how your body processes dietary fats, triglycerides, and cholesterol. Heart attack and stroke are both medical emergencies. There are many things that you can do to help prevent heart disease and stroke:  Have your blood pressure checked at least every 1-2 years. High blood pressure causes heart disease and increases the risk of stroke.  If you are 23-67 years old, ask your health care provider if you should take aspirin to prevent a heart attack or a stroke.  Do not use any tobacco products, including cigarettes, chewing tobacco, or electronic cigarettes. If you need help quitting, ask your health care provider.  It is important to eat a healthy diet and maintain a healthy weight.  Be sure to include plenty of vegetables, fruits, low-fat dairy products, and lean protein.  Avoid eating foods that are high in solid fats, added sugars, or salt (sodium).  Get regular exercise. This is one of the most important things that you can do for your health.  Try to exercise for at least 150 minutes each week. The type of exercise that you do should increase your heart rate and make you sweat. This is known as moderate-intensity exercise.  Try to do strengthening exercises at least twice each week. Do these in addition to the moderate-intensity exercise.  Know your numbers.Ask your health care provider to check your cholesterol and your blood glucose. Continue to have your blood tested as directed by your health care provider. What should I know about cancer screening? There  are several types of cancer. Take the following steps to reduce your risk and to catch any cancer development as early as possible. Breast Cancer  Practice breast self-awareness.  This means understanding how your breasts  normally appear and feel.  It also means doing regular breast self-exams. Let your health care provider know about any changes, no matter how small.  If you are 8 or older, have a clinician do a breast exam (clinical breast exam or CBE) every year. Depending on your age, family history, and medical history, it may be recommended that you also have a yearly breast X-ray (mammogram).  If you have a family history of breast cancer, talk with your health care provider about genetic screening.  If you are at high risk for breast cancer, talk with your health care provider about having an MRI and a mammogram every year.  Breast cancer (BRCA) gene test is recommended for women who have family members with BRCA-related cancers. Results of the assessment will determine the need for genetic counseling and BRCA1 and for BRCA2 testing. BRCA-related cancers include these types:  Breast. This occurs in males or females.  Ovarian.  Tubal. This may also be called fallopian tube cancer.  Cancer of the abdominal or pelvic lining (peritoneal cancer).  Prostate.  Pancreatic. Cervical, Uterine, and Ovarian Cancer  Your health care provider may recommend that you be screened regularly for cancer of the pelvic organs. These include your ovaries, uterus, and vagina. This screening involves a pelvic exam, which includes checking for microscopic changes to the surface of your cervix (Pap test).  For women ages 21-65, health care providers may recommend a pelvic exam and a Pap test every three years. For women ages 54-65, they may recommend the Pap test and pelvic exam, combined with testing for human papilloma virus (HPV), every five years. Some types of HPV increase your risk of cervical cancer. Testing for HPV may also be done on women of any age who have unclear Pap test results.  Other health care providers may not recommend any screening for nonpregnant women who are considered low risk for pelvic cancer and  have no symptoms. Ask your health care provider if a screening pelvic exam is right for you.  If you have had past treatment for cervical cancer or a condition that could lead to cancer, you need Pap tests and screening for cancer for at least 20 years after your treatment. If Pap tests have been discontinued for you, your risk factors (such as having a new sexual partner) need to be reassessed to determine if you should start having screenings again. Some women have medical problems that increase the chance of getting cervical cancer. In these cases, your health care provider may recommend that you have screening and Pap tests more often.  If you have a family history of uterine cancer or ovarian cancer, talk with your health care provider about genetic screening.  If you have vaginal bleeding after reaching menopause, tell your health care provider.  There are currently no reliable tests available to screen for ovarian cancer. Lung Cancer  Lung cancer screening is recommended for adults 67-40 years old who are at high risk for lung cancer because of a history of smoking. A yearly low-dose CT scan of the lungs is recommended if you:  Currently smoke.  Have a history of at least 30 pack-years of smoking and you currently smoke or have quit within the past 15 years. A pack-year is smoking an average  of one pack of cigarettes per day for one year. Yearly screening should:  Continue until it has been 15 years since you quit.  Stop if you develop a health problem that would prevent you from having lung cancer treatment. Colorectal Cancer  This type of cancer can be detected and can often be prevented.  Routine colorectal cancer screening usually begins at age 31 and continues through age 41.  If you have risk factors for colon cancer, your health care provider may recommend that you be screened at an earlier age.  If you have a family history of colorectal cancer, talk with your health care  provider about genetic screening.  Your health care provider may also recommend using home test kits to check for hidden blood in your stool.  A small camera at the end of a tube can be used to examine your colon directly (sigmoidoscopy or colonoscopy). This is done to check for the earliest forms of colorectal cancer.  Direct examination of the colon should be repeated every 5-10 years until age 54. However, if early forms of precancerous polyps or small growths are found or if you have a family history or genetic risk for colorectal cancer, you may need to be screened more often. Skin Cancer  Check your skin from head to toe regularly.  Monitor any moles. Be sure to tell your health care provider:  About any new moles or changes in moles, especially if there is a change in a mole's shape or color.  If you have a mole that is larger than the size of a pencil eraser.  If any of your family members has a history of skin cancer, especially at a young age, talk with your health care provider about genetic screening.  Always use sunscreen. Apply sunscreen liberally and repeatedly throughout the day.  Whenever you are outside, protect yourself by wearing long sleeves, pants, a wide-brimmed hat, and sunglasses. What should I know about osteoporosis? Osteoporosis is a condition in which bone destruction happens more quickly than new bone creation. After menopause, you may be at an increased risk for osteoporosis. To help prevent osteoporosis or the bone fractures that can happen because of osteoporosis, the following is recommended:  If you are 3-68 years old, get at least 1,000 mg of calcium and at least 600 mg of vitamin D per day.  If you are older than age 7 but younger than age 71, get at least 1,200 mg of calcium and at least 600 mg of vitamin D per day.  If you are older than age 79, get at least 1,200 mg of calcium and at least 800 mg of vitamin D per day. Smoking and excessive  alcohol intake increase the risk of osteoporosis. Eat foods that are rich in calcium and vitamin D, and do weight-bearing exercises several times each week as directed by your health care provider. What should I know about how menopause affects my mental health? Depression may occur at any age, but it is more common as you become older. Common symptoms of depression include:  Low or sad mood.  Changes in sleep patterns.  Changes in appetite or eating patterns.  Feeling an overall lack of motivation or enjoyment of activities that you previously enjoyed.  Frequent crying spells. Talk with your health care provider if you think that you are experiencing depression. What should I know about immunizations? It is important that you get and maintain your immunizations. These include:  Tetanus, diphtheria, and pertussis (  Tdap) booster vaccine.  Influenza every year before the flu season begins.  Pneumonia vaccine.  Shingles vaccine. Your health care provider may also recommend other immunizations. This information is not intended to replace advice given to you by your health care provider. Make sure you discuss any questions you have with your health care provider. Document Released: 02/15/2005 Document Revised: 07/14/2015 Document Reviewed: 09/27/2014  2017 [employer]

## 2015-12-22 LAB — PAP IG AND HPV HIGH-RISK
HPV, HIGH-RISK: NEGATIVE
PAP SMEAR COMMENT: 0

## 2016-01-09 ENCOUNTER — Ambulatory Visit (INDEPENDENT_AMBULATORY_CARE_PROVIDER_SITE_OTHER): Payer: BLUE CROSS/BLUE SHIELD | Admitting: Primary Care

## 2016-01-09 ENCOUNTER — Encounter: Payer: Self-pay | Admitting: Primary Care

## 2016-01-09 ENCOUNTER — Ambulatory Visit (INDEPENDENT_AMBULATORY_CARE_PROVIDER_SITE_OTHER)
Admission: RE | Admit: 2016-01-09 | Discharge: 2016-01-09 | Disposition: A | Discharge status: Home / Self Care | Payer: BLUE CROSS/BLUE SHIELD | Source: Ambulatory Visit | Attending: Primary Care | Admitting: Primary Care

## 2016-01-09 VITALS — BP 136/82 | HR 59 | Temp 97.8°F | Ht 66.0 in | Wt 221.0 lb

## 2016-01-09 DIAGNOSIS — G8929 Other chronic pain: Secondary | ICD-10-CM | POA: Diagnosis not present

## 2016-01-09 DIAGNOSIS — M25511 Pain in right shoulder: Secondary | ICD-10-CM

## 2016-01-09 MED ORDER — PREDNISONE 10 MG PO TABS: ORAL_TABLET | ORAL | 0 refills | Status: DC

## 2016-01-09 NOTE — Patient Instructions (Signed)
Start prednisone tablets. Take three tablets for 3 days, then two tablets for 3 days, then one tablet for 3 days.  Complete xray(s) prior to leaving today. I will notify you of your results once received.  Please call me if no resolve of your pain after you complete the prednisone.  It was a pleasure to see you today!

## 2016-01-09 NOTE — Progress Notes (Signed)
Subjective:    Patient ID: Meagan Johnson, female    DOB: 1960-08-15, 56 y.o.   MRN: [id number]  HPI  Ms. Bar is a 56 year old female who presents today with a chief complaint of shoulder pain. Her pain is located to the right shoulder which has been present for the past 4-5 months. She has been working with a physical exercise trainer since July 2017. Her trainer has been working to strengthen her muscles around the shoulder, but she's not noticed improvement in her pain since.   She experiences pain with raising her arm above shoulder level and also with pushing against resistance. She denies numbness/tingling, radiation of pain, neck pain. She does have a history of physical injury by lifting and pulling her husband who has end stage dementia. She's taken Motrin and has rested for the past 2 weeks off from exercising without improvement.   Review of Systems  Musculoskeletal: Positive for arthralgias and myalgias. Negative for joint swelling and neck pain.  Neurological: Negative for weakness and numbness.       Past Medical History:  Diagnosis Date  . Allergy   . Hypertension   . Migraine   . Postmenopausal bleeding 2014  . Vaginal atrophy      Social History   Social History  . Marital status: Married    Spouse name: N/A  . Number of children: N/A  . Years of education: N/A   Occupational History  . Not on file.   Social History Main Topics  . Smoking status: Never Smoker  . Smokeless tobacco: Never Used  . Alcohol use No  . Drug use: No  . Sexual activity: Not Currently    Birth control/ protection: Post-menopausal   Other Topics Concern  . Not on file   Social History Narrative  . No narrative on file    Past Surgical History:  Procedure Laterality Date  . BREAST BIOPSY Left 1987  . DILATION AND CURETTAGE OF UTERUS  2014  . LASIK Bilateral     Family History  Problem Relation Age of Onset  . Alcohol abuse Mother   . Diabetes Mother   .  Alcohol abuse Father   . Diabetes Father   . Diabetes Sister   . Drug abuse Brother   . Hypertension Brother   . Diabetes Brother   . Heart disease Maternal Grandmother   . Diabetes Maternal Grandmother   . Heart disease Maternal Grandfather   . Diabetes Maternal Grandfather   . Heart disease Paternal Grandmother   . Diabetes Paternal Grandmother   . Heart disease Paternal Grandfather   . Diabetes Paternal Grandfather   . Cancer Neg Hx     No Known Allergies  Current Outpatient Prescriptions on File Prior to Visit  Medication Sig Dispense Refill  . atenolol (TENORMIN) 25 MG tablet TAKE 3 TABLETS (75 MG TOTAL) BY MOUTH DAILY. 90 tablet 5  . cetirizine (ZYRTEC) 10 MG tablet Take 1 tablet (10 mg total) by mouth daily. 90 tablet 3  . conjugated estrogens (PREMARIN) vaginal cream Place 1 Applicatorful vaginally daily. 1/2 gram nightly x 30 days- then 1/2 gram twice weekly 42.5 g 12  . fluticasone (FLONASE) 50 MCG/ACT nasal spray Place 1 spray into both nostrils 2 (two) times daily. 16 g 11  . ibuprofen (ADVIL,MOTRIN) 800 MG tablet TAKE 1 TABLET (800 MG TOTAL) BY MOUTH 2 (TWO) TIMES DAILY AS NEEDED FOR MODERATE PAIN. 60 tablet 0  . sertraline (ZOLOFT) 50 MG tablet Take  1 tablet (50 mg total) by mouth daily. 90 tablet 1   No current facility-administered medications on file prior to visit.     BP 136/82   Pulse (!) 59   Temp 97.8 F (36.6 C) (Oral)   Ht 5\' 6"  (1.676 m)   Wt 221 lb (100.2 kg)   SpO2 94%   BMI 35.67 kg/m    Objective:   Physical Exam  Constitutional: She appears well-nourished.  Neck: Normal range of motion. Neck supple. No spinous process tenderness and no muscular tenderness present.  Cardiovascular: Normal rate and regular rhythm.   Pulmonary/Chest: Effort normal and breath sounds normal.  Musculoskeletal:       Right shoulder: She exhibits decreased range of motion and pain. She exhibits no tenderness, no bony tenderness, no swelling, no deformity, no  spasm and normal strength.  Decreased range of motion to right shoulder with most movements that involve abduction. No decreased strength.  Skin: Skin is warm and dry.          Assessment & Plan:

## 2016-01-09 NOTE — Assessment & Plan Note (Addendum)
Located to right shoulder for the past 4-5 months. No improvement with working with an Hospital doctorexercise trainer. Exam today with suspicion for rotator cuff involvement or bursitis. Prescription for prednisone taper sent to pharmacy as NSAIDs are a temporary improvement. X-ray of shoulder pending today. If no improvement on prednisone, consider sports med evaluation and/or MRI.

## 2016-01-09 NOTE — Progress Notes (Signed)
Pre visit review using our clinic review tool, if applicable. No additional management support is needed unless otherwise documented below in the visit note. 

## 2016-01-30 ENCOUNTER — Ambulatory Visit
Admission: RE | Admit: 2016-01-30 | Discharge: 2016-01-30 | Disposition: A | Discharge status: Home / Self Care | Payer: BLUE CROSS/BLUE SHIELD | Source: Ambulatory Visit | Attending: Obstetrics and Gynecology | Admitting: Obstetrics and Gynecology

## 2016-01-30 DIAGNOSIS — Z1239 Encounter for other screening for malignant neoplasm of breast: Secondary | ICD-10-CM

## 2016-01-30 DIAGNOSIS — Z1231 Encounter for screening mammogram for malignant neoplasm of breast: Secondary | ICD-10-CM | POA: Diagnosis present

## 2016-02-06 ENCOUNTER — Other Ambulatory Visit: Payer: Self-pay | Admitting: *Deleted

## 2016-02-06 ENCOUNTER — Inpatient Hospital Stay
Admission: RE | Admit: 2016-02-06 | Discharge: 2016-02-06 | Disposition: A | Discharge status: Home / Self Care | Payer: Self-pay | Source: Ambulatory Visit | Attending: *Deleted | Admitting: *Deleted

## 2016-02-06 DIAGNOSIS — Z9289 Personal history of other medical treatment: Secondary | ICD-10-CM

## 2016-03-04 ENCOUNTER — Other Ambulatory Visit: Payer: Self-pay | Admitting: Primary Care

## 2016-03-04 DIAGNOSIS — I10 Essential (primary) hypertension: Secondary | ICD-10-CM

## 2016-03-22 ENCOUNTER — Other Ambulatory Visit: Payer: Self-pay | Admitting: Primary Care

## 2016-03-22 DIAGNOSIS — J309 Allergic rhinitis, unspecified: Secondary | ICD-10-CM

## 2016-04-08 ENCOUNTER — Ambulatory Visit (INDEPENDENT_AMBULATORY_CARE_PROVIDER_SITE_OTHER): Payer: BLUE CROSS/BLUE SHIELD | Admitting: Primary Care

## 2016-04-08 ENCOUNTER — Encounter: Payer: Self-pay | Admitting: Primary Care

## 2016-04-08 DIAGNOSIS — F32A Depression, unspecified: Secondary | ICD-10-CM

## 2016-04-08 DIAGNOSIS — F419 Anxiety disorder, unspecified: Principal | ICD-10-CM

## 2016-04-08 DIAGNOSIS — I10 Essential (primary) hypertension: Secondary | ICD-10-CM | POA: Diagnosis not present

## 2016-04-08 DIAGNOSIS — F329 Major depressive disorder, single episode, unspecified: Secondary | ICD-10-CM

## 2016-04-08 DIAGNOSIS — F418 Other specified anxiety disorders: Secondary | ICD-10-CM

## 2016-04-08 MED ORDER — SERTRALINE HCL 50 MG PO TABS: 50.0000 mg | ORAL_TABLET | Freq: Every day | ORAL | 1 refills | Status: DC

## 2016-04-08 NOTE — Assessment & Plan Note (Signed)
Doing well on Zoloft, also seeing counselor.  Refill provided today, continue current regimen.

## 2016-04-08 NOTE — Progress Notes (Signed)
Pre visit review using our clinic review tool, if applicable. No additional management support is needed unless otherwise documented below in the visit note. 

## 2016-04-08 NOTE — Progress Notes (Signed)
Subjective:    Patient ID: Meagan Johnson, female    DOB: 1960/04/01, 56 y.o.   MRN: [id number]  HPI  Ms. Fausto is a 56 year old female who presents today with a chief complaint of hypertension.  She is currently managed on atenolol 75 mg once daily. Her blood pressure in the clinic today is 130/84. Her blood pressure readings on prior office visits have been stable.   She's been exercising 5-7 days weekly and has lost 16 pounds since her last visit in January 2018. She's been checking her BP at home on the right arm and is getting readings of 140-160/50's/60's. Each times she's in the office her BP is measured on the left arm. She checked her BP in our office with her home cuff and got similar readings as our manual cuff. She denies chest pain, dizziness, shortness of breath. She is worried about these numbers.   Review of Systems  Eyes: Negative for visual disturbance.  Respiratory: Negative for cough and shortness of breath.   Cardiovascular: Negative for chest pain.  Neurological: Negative for dizziness and headaches.       Past Medical History:  Diagnosis Date  . Allergy   . Hypertension   . Migraine   . Postmenopausal bleeding 2014  . Vaginal atrophy      Social History   Social History  . Marital status: Married    Spouse name: N/A  . Number of children: N/A  . Years of education: N/A   Occupational History  . Not on file.   Social History Main Topics  . Smoking status: Never Smoker  . Smokeless tobacco: Never Used  . Alcohol use No  . Drug use: No  . Sexual activity: Not Currently    Birth control/ protection: Post-menopausal   Other Topics Concern  . Not on file   Social History Narrative  . No narrative on file    Past Surgical History:  Procedure Laterality Date  . BREAST BIOPSY Left 1987  . DILATION AND CURETTAGE OF UTERUS  2014  . LASIK Bilateral     Family History  Problem Relation Age of Onset  . Alcohol abuse Mother   . Diabetes  Mother   . Alcohol abuse Father   . Diabetes Father   . Diabetes Sister   . Drug abuse Brother   . Hypertension Brother   . Diabetes Brother   . Heart disease Maternal Grandmother   . Diabetes Maternal Grandmother   . Heart disease Maternal Grandfather   . Diabetes Maternal Grandfather   . Heart disease Paternal Grandmother   . Diabetes Paternal Grandmother   . Heart disease Paternal Grandfather   . Diabetes Paternal Grandfather   . Cancer Neg Hx     No Known Allergies  Current Outpatient Prescriptions on File Prior to Visit  Medication Sig Dispense Refill  . atenolol (TENORMIN) 25 MG tablet TAKE THREE TABLETS BY MOUTH DAILY 90 tablet 3  . cetirizine (ZYRTEC) 10 MG tablet TAKE 1 TABLET (10 MG TOTAL) BY MOUTH DAILY. 90 tablet 1  . conjugated estrogens (PREMARIN) vaginal cream Place 1 Applicatorful vaginally daily. 1/2 gram nightly x 30 days- then 1/2 gram twice weekly 42.5 g 12  . fluticasone (FLONASE) 50 MCG/ACT nasal spray Place 1 spray into both nostrils 2 (two) times daily. 16 g 11  . ibuprofen (ADVIL,MOTRIN) 800 MG tablet TAKE 1 TABLET (800 MG TOTAL) BY MOUTH 2 (TWO) TIMES DAILY AS NEEDED FOR MODERATE PAIN. 60 tablet 0  No current facility-administered medications on file prior to visit.     BP 130/84   Pulse 66   Temp 98.1 F (36.7 C) (Oral)   Ht  (1.676 m)   Wt 205 lb 12.8 oz (93.4 kg)   SpO2 95%   BMI 33.22 kg/m    Objective:   Physical Exam  Constitutional: She appears well-nourished.  Neck: Neck supple.  Cardiovascular: Normal rate and regular rhythm.   Pulmonary/Chest: Effort normal and breath sounds normal.  Skin: Skin is warm and dry.          Assessment & Plan:

## 2016-04-08 NOTE — Assessment & Plan Note (Signed)
Home readings on right extremity above goal. Manual reading in the office on left upper extremity stable and consistent with prior office readings.  Will have her monitor BP 1-2 times weekly on left upper extremity for consistency. Continue current dose of Atenolol. Will continue to monitor.

## 2016-04-08 NOTE — Patient Instructions (Signed)
Continue to monitor your blood pressure several times weekly. Switch to the left arm.  Continue Atenolol 75 mg once daily.  I sent refills of Zoloft to the pharmacy.  Please schedule a physical with me sometime this Spring/Summer. You may also schedule a lab only appointment 3-4 days prior. We will discuss your lab results in detail during your physical.  It was a pleasure to see you today!

## 2016-07-08 ENCOUNTER — Other Ambulatory Visit: Payer: Self-pay | Admitting: Primary Care

## 2016-07-08 DIAGNOSIS — I10 Essential (primary) hypertension: Secondary | ICD-10-CM

## 2016-07-12 ENCOUNTER — Other Ambulatory Visit: Payer: Self-pay | Admitting: Primary Care

## 2016-07-12 DIAGNOSIS — Z0000 Encounter for general adult medical examination without abnormal findings: Secondary | ICD-10-CM

## 2016-07-12 DIAGNOSIS — E559 Vitamin D deficiency, unspecified: Secondary | ICD-10-CM

## 2016-07-12 DIAGNOSIS — I10 Essential (primary) hypertension: Secondary | ICD-10-CM

## 2016-07-18 ENCOUNTER — Other Ambulatory Visit (INDEPENDENT_AMBULATORY_CARE_PROVIDER_SITE_OTHER): Payer: BLUE CROSS/BLUE SHIELD

## 2016-07-18 DIAGNOSIS — Z0000 Encounter for general adult medical examination without abnormal findings: Secondary | ICD-10-CM

## 2016-07-18 DIAGNOSIS — E559 Vitamin D deficiency, unspecified: Secondary | ICD-10-CM | POA: Diagnosis not present

## 2016-07-18 DIAGNOSIS — I10 Essential (primary) hypertension: Secondary | ICD-10-CM | POA: Diagnosis not present

## 2016-07-18 LAB — COMPREHENSIVE METABOLIC PANEL
ALT: 15 U/L (ref 0–35)
AST: 18 U/L (ref 0–37)
Albumin: 4.1 g/dL (ref 3.5–5.2)
Alkaline Phosphatase: 63 U/L (ref 39–117)
BUN: 16 mg/dL (ref 6–23)
CO2: 30 meq/L (ref 19–32)
Calcium: 9.8 mg/dL (ref 8.4–10.5)
Chloride: 106 mEq/L (ref 96–112)
Creatinine, Ser: 0.95 mg/dL (ref 0.40–1.20)
GFR: 64.28 mL/min (ref >60.00)
GLUCOSE: 101 mg/dL — AB (ref 70–99)
POTASSIUM: 4.1 meq/L (ref 3.5–5.1)
SODIUM: 141 meq/L (ref 135–145)
Total Bilirubin: 0.7 mg/dL (ref 0.2–1.2)
Total Protein: 7.2 g/dL (ref 6.0–8.3)

## 2016-07-18 LAB — LIPID PANEL
CHOL/HDL RATIO: 4
Cholesterol: 189 mg/dL (ref 0–200)
HDL: 47.9 mg/dL (ref >39.00)
LDL CALC: 127 mg/dL — AB (ref 0–99)
NonHDL: 141.56
TRIGLYCERIDES: 75 mg/dL (ref 0.0–149.0)
VLDL: 15 mg/dL (ref 0.0–40.0)

## 2016-07-18 LAB — VITAMIN D 25 HYDROXY (VIT D DEFICIENCY, FRACTURES): VITD: 27.68 ng/mL — ABNORMAL LOW (ref 30.00–100.00)

## 2016-07-18 LAB — HEMOGLOBIN A1C: Hgb A1c MFr Bld: 5.7 % (ref 4.6–6.5)

## 2016-07-23 ENCOUNTER — Ambulatory Visit (INDEPENDENT_AMBULATORY_CARE_PROVIDER_SITE_OTHER): Payer: BLUE CROSS/BLUE SHIELD | Admitting: Primary Care

## 2016-07-23 ENCOUNTER — Encounter: Payer: Self-pay | Admitting: Primary Care

## 2016-07-23 VITALS — BP 120/84 | HR 62 | Temp 98.1°F | Ht 66.0 in | Wt 193.8 lb

## 2016-07-23 DIAGNOSIS — E559 Vitamin D deficiency, unspecified: Secondary | ICD-10-CM | POA: Diagnosis not present

## 2016-07-23 DIAGNOSIS — F419 Anxiety disorder, unspecified: Secondary | ICD-10-CM

## 2016-07-23 DIAGNOSIS — F329 Major depressive disorder, single episode, unspecified: Secondary | ICD-10-CM

## 2016-07-23 DIAGNOSIS — Z0000 Encounter for general adult medical examination without abnormal findings: Secondary | ICD-10-CM | POA: Diagnosis not present

## 2016-07-23 DIAGNOSIS — R7303 Prediabetes: Secondary | ICD-10-CM

## 2016-07-23 DIAGNOSIS — I10 Essential (primary) hypertension: Secondary | ICD-10-CM | POA: Diagnosis not present

## 2016-07-23 MED ORDER — LISINOPRIL 10 MG PO TABS
10.0000 mg | ORAL_TABLET | Freq: Every day | ORAL | 0 refills | Status: DC
Start: 2016-07-23 — End: 2016-08-13

## 2016-07-23 NOTE — Assessment & Plan Note (Signed)
Stable today on atenolol 75 mg. Will switch to lisinopril 10 mg once daily given side effects of hot flashes with atenolol. Will wean off atenolol slowly over the next 15 days, instructions provided.  Follow up in 3 weeks for re-evaluation and BMP.

## 2016-07-23 NOTE — Assessment & Plan Note (Signed)
Immunizations UTD. Pap and mammogram UTD. Colonoscopy UTD, due in 2021. Commended her on her healthy diet and regular exercise. Discussed the importance of a healthy diet and regular exercise in order for weight loss, and to reduce the risk of other medical problems. Exam unremarkable. Labs overall stable. Follow up in 1 year for annual exam.

## 2016-07-23 NOTE — Assessment & Plan Note (Signed)
A1C of 5.7 on recent labs. Encouraged her to continue regular exercise and healthy diet. Will continue to monitor.

## 2016-07-23 NOTE — Patient Instructions (Addendum)
We will need to wean you off of atenolol. Reduce atenolol to 50 mg once nightly for 5 nights, then 25 mg for 5 nights, then 12.5 mg for 5 nights then stop.  Start lisinopril 10 mg tablets during the 25 mg dose. You can start sooner if you notice a rise in your blood pressure.  Continue to monitor your blood pressure and schedule a follow up visit with me in 3 weeks.   Continue exercising. You should be getting 150 minutes of moderate intensity exercise weekly.  Continue your efforts towards a healthy diet.  Follow up with GYN as scheduled.  We will see you in 3 weeks! It was a pleasure to see you today!

## 2016-07-23 NOTE — Assessment & Plan Note (Signed)
Doing well on Zoloft. Has help with care of her husband at home which has helped her mood and overall happiness.

## 2016-07-23 NOTE — Assessment & Plan Note (Signed)
Continues to be low, not taking vitamin D regularly, discussed to restart.

## 2016-07-23 NOTE — Progress Notes (Signed)
Subjective:    Patient ID: Meagan Johnson, female    DOB: May 08, 1960, 56 y.o.   MRN: [id number]  HPI  Meagan Johnson is a 56 year old female who presents today for complete physical.  Would like to switch blood pressure medications due to side effects of hot flashes. She's been on atenolol for years and is taking 75 mg at bedtime.  Immunizations: -Tetanus: Completed 3-4 years ago. -Influenza: Did not completed last season.   Diet: She endorses a healthy diet. Breakfast: Skips, protein bar, eggs, avocado  Lunch: Salad, Malawiturkey patty, chicken Dinner: Veggies, protein Snacks: None Desserts: None Beverages: Water, coffee, unsweet tea  Exercise: She is exercising at the gym 4 times weekly Eye exam: Completed in November 2017 Dental exam: Has not completed recently.  Colonoscopy: Completed in 2011 Pap Smear: Completed in 2017 Mammogram: Completed in January 2018   Review of Systems  Constitutional: Negative for unexpected weight change.  HENT: Negative for rhinorrhea.   Respiratory: Negative for cough and shortness of breath.   Cardiovascular: Negative for chest pain.  Gastrointestinal: Negative for constipation and diarrhea.  Genitourinary: Negative for difficulty urinating and menstrual problem.  Musculoskeletal: Negative for arthralgias and myalgias.  Skin: Negative for rash.  Allergic/Immunologic: Negative for environmental allergies.  Neurological: Negative for dizziness, numbness and headaches.  Psychiatric/Behavioral:       Feels well managed on Zoloft.       Past Medical History:  Diagnosis Date  . Allergy   . Hypertension   . Migraine   . Postmenopausal bleeding 2014  . Vaginal atrophy      Social History   Social History  . Marital status: Married    Spouse name: N/A  . Number of children: N/A  . Years of education: N/A   Occupational History  . Not on file.   Social History Main Topics  . Smoking status: Never Smoker  . Smokeless tobacco:  Never Used  . Alcohol use No  . Drug use: No  . Sexual activity: Not Currently    Birth control/ protection: Post-menopausal   Other Topics Concern  . Not on file   Social History Narrative  . No narrative on file    Past Surgical History:  Procedure Laterality Date  . BREAST BIOPSY Left 1987  . DILATION AND CURETTAGE OF UTERUS  2014  . LASIK Bilateral     Family History  Problem Relation Age of Onset  . Alcohol abuse Mother   . Diabetes Mother   . Alcohol abuse Father   . Diabetes Father   . Diabetes Sister   . Drug abuse Brother   . Hypertension Brother   . Diabetes Brother   . Heart disease Maternal Grandmother   . Diabetes Maternal Grandmother   . Heart disease Maternal Grandfather   . Diabetes Maternal Grandfather   . Heart disease Paternal Grandmother   . Diabetes Paternal Grandmother   . Heart disease Paternal Grandfather   . Diabetes Paternal Grandfather   . Cancer Neg Hx     No Known Allergies  Current Outpatient Prescriptions on File Prior to Visit  Medication Sig Dispense Refill  . CALCIUM PO Take by mouth.    . cetirizine (ZYRTEC) 10 MG tablet TAKE 1 TABLET (10 MG TOTAL) BY MOUTH DAILY. 90 tablet 1  . Cholecalciferol (D3 ADULT PO) Take 1,000 Units by mouth daily.    Marland Kitchen. conjugated estrogens (PREMARIN) vaginal cream Place 1 Applicatorful vaginally daily. 1/2 gram nightly x 30 days- then  1/2 gram twice weekly 42.5 g 12  . fluticasone (FLONASE) 50 MCG/ACT nasal spray Place 1 spray into both nostrils 2 (two) times daily. 16 g 11  . ibuprofen (ADVIL,MOTRIN) 800 MG tablet TAKE 1 TABLET (800 MG TOTAL) BY MOUTH 2 (TWO) TIMES DAILY AS NEEDED FOR MODERATE PAIN. 60 tablet 0  . Multiple Vitamin (MULTIVITAMIN) tablet Take 1 tablet by mouth daily.    . sertraline (ZOLOFT) 50 MG tablet Take 1 tablet (50 mg total) by mouth daily. 90 tablet 1   No current facility-administered medications on file prior to visit.     BP 120/84   Pulse 62   Temp 98.1 F (36.7 C)  (Oral)   Ht 5\' 6"  (1.676 m)   Wt 193 lb 12.8 oz (87.9 kg)   SpO2 93%   BMI 31.28 kg/m    Objective:   Physical Exam  Constitutional: She is oriented to person, place, and time. She appears well-nourished.  HENT:  Right Ear: Tympanic membrane and ear canal normal.  Left Ear: Tympanic membrane and ear canal normal.  Nose: Nose normal.  Mouth/Throat: Oropharynx is clear and moist.  Eyes: Pupils are equal, round, and reactive to light. Conjunctivae and EOM are normal.  Neck: Neck supple. No thyromegaly present.  Cardiovascular: Normal rate and regular rhythm.   No murmur heard. Pulmonary/Chest: Effort normal and breath sounds normal. She has no rales.  Abdominal: Soft. Bowel sounds are normal. There is no tenderness.  Musculoskeletal: Normal range of motion.  Lymphadenopathy:    She has no cervical adenopathy.  Neurological: She is alert and oriented to person, place, and time. She has normal reflexes. No cranial nerve deficit.  Skin: Skin is warm and dry. No rash noted.  Psychiatric: She has a normal mood and affect.          Assessment & Plan:

## 2016-08-13 ENCOUNTER — Ambulatory Visit (INDEPENDENT_AMBULATORY_CARE_PROVIDER_SITE_OTHER): Payer: BLUE CROSS/BLUE SHIELD | Admitting: Primary Care

## 2016-08-13 ENCOUNTER — Encounter: Payer: Self-pay | Admitting: Primary Care

## 2016-08-13 VITALS — BP 132/78 | HR 75 | Temp 98.1°F | Ht 66.0 in | Wt 189.8 lb

## 2016-08-13 DIAGNOSIS — I10 Essential (primary) hypertension: Secondary | ICD-10-CM | POA: Diagnosis not present

## 2016-08-13 DIAGNOSIS — R21 Rash and other nonspecific skin eruption: Secondary | ICD-10-CM

## 2016-08-13 MED ORDER — TRIAMCINOLONE ACETONIDE 0.1 % EX CREA
1.0000 | TOPICAL_CREAM | Freq: Two times a day (BID) | CUTANEOUS | 0 refills | Status: DC
Start: 2016-08-13 — End: 2020-03-10

## 2016-08-13 NOTE — Progress Notes (Signed)
Subjective:    Patient ID: Meagan Johnson, female    DOB: 05/17/1960, 56 y.o.   MRN: [id number]  HPI  Meagan Johnson is a 56 year old female who presents today for follow up of hypertension. During her last visit she endorsed hot flashes with atenolol 75 mg, therefore, her atenolol was weaned slowly and was switched to lisinopril 10 mg. She presents today for follow up and BMP.  Her blood pressure in the office today is 132/78. She's not taking either her atenolol or lisinopril. She never started taking her lisinopril 10 mg. BP with 25 mg of Atenolol was running 118-120's/50's-60's, and without medications her BP was running 120-150's/60's-70's.   2) Rash: Located to the external nose at bottom of nares. History of this for years. Was once prescribed a cream with resolve, doesn't recall the name. She's been using OTC hydrocortisone cream without improvement.   Review of Systems  Constitutional: Negative for fatigue.  Eyes: Negative for visual disturbance.  Respiratory: Negative for shortness of breath.   Cardiovascular: Negative for chest pain.  Genitourinary:       Decrease in facial flushing  Skin: Positive for rash.       Past Medical History:  Diagnosis Date  . Allergy   . Hypertension   . Migraine   . Postmenopausal bleeding 2014  . Vaginal atrophy      Social History   Social History  . Marital status: Married    Spouse name: N/A  . Number of children: N/A  . Years of education: N/A   Occupational History  . Not on file.   Social History Main Topics  . Smoking status: Never Smoker  . Smokeless tobacco: Never Used  . Alcohol use No  . Drug use: No  . Sexual activity: Not Currently    Birth control/ protection: Post-menopausal   Other Topics Concern  . Not on file   Social History Narrative  . No narrative on file    Past Surgical History:  Procedure Laterality Date  . BREAST BIOPSY Left 1987  . DILATION AND CURETTAGE OF UTERUS  2014  . LASIK  Bilateral     Family History  Problem Relation Age of Onset  . Alcohol abuse Mother   . Diabetes Mother   . Alcohol abuse Father   . Diabetes Father   . Diabetes Sister   . Drug abuse Brother   . Hypertension Brother   . Diabetes Brother   . Heart disease Maternal Grandmother   . Diabetes Maternal Grandmother   . Heart disease Maternal Grandfather   . Diabetes Maternal Grandfather   . Heart disease Paternal Grandmother   . Diabetes Paternal Grandmother   . Heart disease Paternal Grandfather   . Diabetes Paternal Grandfather   . Cancer Neg Hx     No Known Allergies  Current Outpatient Prescriptions on File Prior to Visit  Medication Sig Dispense Refill  . CALCIUM PO Take by mouth.    . cetirizine (ZYRTEC) 10 MG tablet TAKE 1 TABLET (10 MG TOTAL) BY MOUTH DAILY. 90 tablet 1  . Cholecalciferol (D3 ADULT PO) Take 1,000 Units by mouth daily.    Marland Kitchen conjugated estrogens (PREMARIN) vaginal cream Place 1 Applicatorful vaginally daily. 1/2 gram nightly x 30 days- then 1/2 gram twice weekly 42.5 g 12  . fluticasone (FLONASE) 50 MCG/ACT nasal spray Place 1 spray into both nostrils 2 (two) times daily. 16 g 11  . ibuprofen (ADVIL,MOTRIN) 800 MG tablet TAKE 1 TABLET (  800 MG TOTAL) BY MOUTH 2 (TWO) TIMES DAILY AS NEEDED FOR MODERATE PAIN. 60 tablet 0  . Multiple Vitamin (MULTIVITAMIN) tablet Take 1 tablet by mouth daily.    . sertraline (ZOLOFT) 50 MG tablet Take 1 tablet (50 mg total) by mouth daily. 90 tablet 1   No current facility-administered medications on file prior to visit.     BP 132/78   Pulse 75   Temp 98.1 F (36.7 C) (Oral)   Ht 5\' 6"  (1.676 m)   Wt 189 lb 12.8 oz (86.1 kg)   SpO2 95%   BMI 30.63 kg/m    Objective:   Physical Exam  Constitutional: She appears well-nourished.  Neck: Neck supple.  Cardiovascular: Normal rate and regular rhythm.   Pulmonary/Chest: Effort normal and breath sounds normal.  Skin: Skin is warm and dry.  Mild amount of redness to right  external nares at base           Assessment & Plan:

## 2016-08-13 NOTE — Patient Instructions (Signed)
Start lisinopril 10 mg tablets for high blood pressure. Take 1 tablet by mouth once daily.  Continue to monitor your blood pressure and message me your readings in 2 weeks.  Schedule a lab only appointment in 2 weeks to recheck your electrolytes.  Apply the triamcinolone cream twice daily. Use sparingly to face, wash hands after use.  It was a pleasure to see you today!

## 2016-08-13 NOTE — Assessment & Plan Note (Signed)
Too high off of all meds. Recommended she start lisinopril 10 mg as prescribed. She will start lisinopril and monitor BP over the next 2 weeks. She will message us with her BP readings. BMP in 2 weeks.

## 2016-08-13 NOTE — Assessment & Plan Note (Signed)
Chronic to base of right external nares. Appears to be eczema-like. Rx for low potency triamcinolone cream sent to pharmacy. Precautions provided regarding skin atrophy, she verbalized understanding.

## 2016-08-18 ENCOUNTER — Other Ambulatory Visit: Payer: Self-pay | Admitting: Primary Care

## 2016-08-18 DIAGNOSIS — I10 Essential (primary) hypertension: Secondary | ICD-10-CM

## 2016-08-26 ENCOUNTER — Encounter: Payer: Self-pay | Admitting: Primary Care

## 2016-08-27 ENCOUNTER — Other Ambulatory Visit (INDEPENDENT_AMBULATORY_CARE_PROVIDER_SITE_OTHER): Payer: BLUE CROSS/BLUE SHIELD

## 2016-08-27 ENCOUNTER — Encounter: Payer: Self-pay | Admitting: Primary Care

## 2016-08-27 DIAGNOSIS — I10 Essential (primary) hypertension: Secondary | ICD-10-CM

## 2016-08-27 LAB — BASIC METABOLIC PANEL
BUN: 11 mg/dL (ref 6–23)
CALCIUM: 9.3 mg/dL (ref 8.4–10.5)
CO2: 28 meq/L (ref 19–32)
CREATININE: 0.93 mg/dL (ref 0.40–1.20)
Chloride: 107 mEq/L (ref 96–112)
GFR: 65.85 mL/min (ref >60.00)
GLUCOSE: 92 mg/dL (ref 70–99)
Potassium: 3.4 mEq/L — ABNORMAL LOW (ref 3.5–5.1)
SODIUM: 142 meq/L (ref 135–145)

## 2016-09-16 ENCOUNTER — Encounter: Payer: Self-pay | Admitting: Primary Care

## 2016-10-06 ENCOUNTER — Encounter: Payer: Self-pay | Admitting: Primary Care

## 2016-10-06 DIAGNOSIS — F329 Major depressive disorder, single episode, unspecified: Secondary | ICD-10-CM

## 2016-10-06 DIAGNOSIS — F419 Anxiety disorder, unspecified: Principal | ICD-10-CM

## 2016-10-07 MED ORDER — SERTRALINE HCL 50 MG PO TABS: ORAL_TABLET | ORAL | 1 refills | Status: DC

## 2016-10-11 ENCOUNTER — Other Ambulatory Visit: Payer: Self-pay | Admitting: Primary Care

## 2016-10-11 DIAGNOSIS — F329 Major depressive disorder, single episode, unspecified: Secondary | ICD-10-CM

## 2016-10-11 DIAGNOSIS — F419 Anxiety disorder, unspecified: Principal | ICD-10-CM

## 2016-10-30 ENCOUNTER — Encounter: Payer: Self-pay | Admitting: Primary Care

## 2016-10-30 NOTE — Telephone Encounter (Signed)
I have given this information to [place], Print production planneroffice manager for a resolution. Pt is not listed as deceased, which is triggering [facility] health maintenance calls

## 2017-02-14 ENCOUNTER — Other Ambulatory Visit: Payer: Self-pay | Admitting: Primary Care

## 2017-02-14 DIAGNOSIS — I10 Essential (primary) hypertension: Secondary | ICD-10-CM

## 2017-04-14 ENCOUNTER — Other Ambulatory Visit: Payer: Self-pay | Admitting: Primary Care

## 2017-04-14 DIAGNOSIS — F329 Major depressive disorder, single episode, unspecified: Secondary | ICD-10-CM

## 2017-04-14 DIAGNOSIS — F419 Anxiety disorder, unspecified: Principal | ICD-10-CM

## 2017-05-12 ENCOUNTER — Other Ambulatory Visit: Payer: Self-pay | Admitting: Primary Care

## 2017-05-12 DIAGNOSIS — I10 Essential (primary) hypertension: Secondary | ICD-10-CM

## 2017-07-29 ENCOUNTER — Other Ambulatory Visit: Payer: Self-pay | Admitting: Primary Care

## 2017-07-29 DIAGNOSIS — F419 Anxiety disorder, unspecified: Principal | ICD-10-CM

## 2017-07-29 DIAGNOSIS — F329 Major depressive disorder, single episode, unspecified: Secondary | ICD-10-CM

## 2017-08-05 DIAGNOSIS — M9901 Segmental and somatic dysfunction of cervical region: Secondary | ICD-10-CM | POA: Diagnosis not present

## 2017-08-05 DIAGNOSIS — M5032 Other cervical disc degeneration, mid-cervical region, unspecified level: Secondary | ICD-10-CM | POA: Diagnosis not present

## 2017-08-05 DIAGNOSIS — M9902 Segmental and somatic dysfunction of thoracic region: Secondary | ICD-10-CM | POA: Diagnosis not present

## 2017-08-05 DIAGNOSIS — M4004 Postural kyphosis, thoracic region: Secondary | ICD-10-CM | POA: Diagnosis not present

## 2017-08-11 ENCOUNTER — Other Ambulatory Visit: Payer: Self-pay | Admitting: Primary Care

## 2017-08-11 DIAGNOSIS — I10 Essential (primary) hypertension: Secondary | ICD-10-CM

## 2017-08-13 DIAGNOSIS — M4004 Postural kyphosis, thoracic region: Secondary | ICD-10-CM | POA: Diagnosis not present

## 2017-08-13 DIAGNOSIS — M9902 Segmental and somatic dysfunction of thoracic region: Secondary | ICD-10-CM | POA: Diagnosis not present

## 2017-08-13 DIAGNOSIS — M9901 Segmental and somatic dysfunction of cervical region: Secondary | ICD-10-CM | POA: Diagnosis not present

## 2017-08-13 DIAGNOSIS — M5032 Other cervical disc degeneration, mid-cervical region, unspecified level: Secondary | ICD-10-CM | POA: Diagnosis not present

## 2017-08-15 DIAGNOSIS — M9901 Segmental and somatic dysfunction of cervical region: Secondary | ICD-10-CM | POA: Diagnosis not present

## 2017-08-15 DIAGNOSIS — M5032 Other cervical disc degeneration, mid-cervical region, unspecified level: Secondary | ICD-10-CM | POA: Diagnosis not present

## 2017-08-15 DIAGNOSIS — M9902 Segmental and somatic dysfunction of thoracic region: Secondary | ICD-10-CM | POA: Diagnosis not present

## 2017-08-15 DIAGNOSIS — M4004 Postural kyphosis, thoracic region: Secondary | ICD-10-CM | POA: Diagnosis not present

## 2017-08-19 DIAGNOSIS — M9902 Segmental and somatic dysfunction of thoracic region: Secondary | ICD-10-CM | POA: Diagnosis not present

## 2017-08-19 DIAGNOSIS — M5032 Other cervical disc degeneration, mid-cervical region, unspecified level: Secondary | ICD-10-CM | POA: Diagnosis not present

## 2017-08-19 DIAGNOSIS — M9901 Segmental and somatic dysfunction of cervical region: Secondary | ICD-10-CM | POA: Diagnosis not present

## 2017-08-19 DIAGNOSIS — M4004 Postural kyphosis, thoracic region: Secondary | ICD-10-CM | POA: Diagnosis not present

## 2017-08-21 DIAGNOSIS — M4004 Postural kyphosis, thoracic region: Secondary | ICD-10-CM | POA: Diagnosis not present

## 2017-08-21 DIAGNOSIS — M9901 Segmental and somatic dysfunction of cervical region: Secondary | ICD-10-CM | POA: Diagnosis not present

## 2017-08-21 DIAGNOSIS — M9902 Segmental and somatic dysfunction of thoracic region: Secondary | ICD-10-CM | POA: Diagnosis not present

## 2017-08-21 DIAGNOSIS — M5032 Other cervical disc degeneration, mid-cervical region, unspecified level: Secondary | ICD-10-CM | POA: Diagnosis not present

## 2017-08-26 DIAGNOSIS — M9901 Segmental and somatic dysfunction of cervical region: Secondary | ICD-10-CM | POA: Diagnosis not present

## 2017-08-26 DIAGNOSIS — M5032 Other cervical disc degeneration, mid-cervical region, unspecified level: Secondary | ICD-10-CM | POA: Diagnosis not present

## 2017-08-26 DIAGNOSIS — M9902 Segmental and somatic dysfunction of thoracic region: Secondary | ICD-10-CM | POA: Diagnosis not present

## 2017-08-26 DIAGNOSIS — M4004 Postural kyphosis, thoracic region: Secondary | ICD-10-CM | POA: Diagnosis not present

## 2017-08-28 DIAGNOSIS — M5032 Other cervical disc degeneration, mid-cervical region, unspecified level: Secondary | ICD-10-CM | POA: Diagnosis not present

## 2017-08-28 DIAGNOSIS — M4004 Postural kyphosis, thoracic region: Secondary | ICD-10-CM | POA: Diagnosis not present

## 2017-08-28 DIAGNOSIS — M9902 Segmental and somatic dysfunction of thoracic region: Secondary | ICD-10-CM | POA: Diagnosis not present

## 2017-08-28 DIAGNOSIS — M9901 Segmental and somatic dysfunction of cervical region: Secondary | ICD-10-CM | POA: Diagnosis not present

## 2017-09-03 DIAGNOSIS — M5032 Other cervical disc degeneration, mid-cervical region, unspecified level: Secondary | ICD-10-CM | POA: Diagnosis not present

## 2017-09-03 DIAGNOSIS — M9902 Segmental and somatic dysfunction of thoracic region: Secondary | ICD-10-CM | POA: Diagnosis not present

## 2017-09-03 DIAGNOSIS — M9901 Segmental and somatic dysfunction of cervical region: Secondary | ICD-10-CM | POA: Diagnosis not present

## 2017-09-03 DIAGNOSIS — M4004 Postural kyphosis, thoracic region: Secondary | ICD-10-CM | POA: Diagnosis not present

## 2017-09-12 ENCOUNTER — Other Ambulatory Visit: Payer: Self-pay | Admitting: Primary Care

## 2017-09-12 DIAGNOSIS — I10 Essential (primary) hypertension: Secondary | ICD-10-CM

## 2017-10-08 ENCOUNTER — Other Ambulatory Visit: Payer: Self-pay | Admitting: Primary Care

## 2017-10-08 DIAGNOSIS — I10 Essential (primary) hypertension: Secondary | ICD-10-CM

## 2017-10-15 ENCOUNTER — Other Ambulatory Visit: Payer: Self-pay | Admitting: Primary Care

## 2017-10-15 DIAGNOSIS — R7303 Prediabetes: Secondary | ICD-10-CM

## 2017-10-15 DIAGNOSIS — E559 Vitamin D deficiency, unspecified: Secondary | ICD-10-CM

## 2017-10-15 DIAGNOSIS — I10 Essential (primary) hypertension: Secondary | ICD-10-CM

## 2017-10-21 ENCOUNTER — Other Ambulatory Visit (INDEPENDENT_AMBULATORY_CARE_PROVIDER_SITE_OTHER): Payer: BLUE CROSS/BLUE SHIELD

## 2017-10-21 DIAGNOSIS — I10 Essential (primary) hypertension: Secondary | ICD-10-CM | POA: Diagnosis not present

## 2017-10-21 DIAGNOSIS — E559 Vitamin D deficiency, unspecified: Secondary | ICD-10-CM

## 2017-10-21 DIAGNOSIS — R7303 Prediabetes: Secondary | ICD-10-CM

## 2017-10-21 LAB — COMPREHENSIVE METABOLIC PANEL
ALT: 18 U/L (ref 0–35)
AST: 18 U/L (ref 0–37)
Albumin: 4.1 g/dL (ref 3.5–5.2)
Alkaline Phosphatase: 64 U/L (ref 39–117)
BUN: 17 mg/dL (ref 6–23)
CALCIUM: 9.8 mg/dL (ref 8.4–10.5)
CHLORIDE: 105 meq/L (ref 96–112)
CO2: 29 meq/L (ref 19–32)
Creatinine, Ser: 0.8 mg/dL (ref 0.40–1.20)
GFR: 78.03 mL/min (ref >60.00)
Glucose, Bld: 102 mg/dL — ABNORMAL HIGH (ref 70–99)
POTASSIUM: 4 meq/L (ref 3.5–5.1)
SODIUM: 139 meq/L (ref 135–145)
Total Bilirubin: 0.5 mg/dL (ref 0.2–1.2)
Total Protein: 7.6 g/dL (ref 6.0–8.3)

## 2017-10-21 LAB — LIPID PANEL
CHOLESTEROL: 167 mg/dL (ref 0–200)
HDL: 50.4 mg/dL (ref >39.00)
LDL Cholesterol: 103 mg/dL — ABNORMAL HIGH (ref 0–99)
NonHDL: 116.79
Total CHOL/HDL Ratio: 3
Triglycerides: 70 mg/dL (ref 0.0–149.0)
VLDL: 14 mg/dL (ref 0.0–40.0)

## 2017-10-21 LAB — VITAMIN D 25 HYDROXY (VIT D DEFICIENCY, FRACTURES): VITD: 36.89 ng/mL (ref 30.00–100.00)

## 2017-10-21 LAB — HEMOGLOBIN A1C: Hgb A1c MFr Bld: 5.6 % (ref 4.6–6.5)

## 2017-10-27 ENCOUNTER — Other Ambulatory Visit: Payer: Self-pay | Admitting: Primary Care

## 2017-10-27 DIAGNOSIS — F419 Anxiety disorder, unspecified: Principal | ICD-10-CM

## 2017-10-27 DIAGNOSIS — F329 Major depressive disorder, single episode, unspecified: Secondary | ICD-10-CM

## 2017-10-27 DIAGNOSIS — F32A Depression, unspecified: Secondary | ICD-10-CM

## 2017-10-30 DIAGNOSIS — M4004 Postural kyphosis, thoracic region: Secondary | ICD-10-CM | POA: Diagnosis not present

## 2017-10-30 DIAGNOSIS — M5032 Other cervical disc degeneration, mid-cervical region, unspecified level: Secondary | ICD-10-CM | POA: Diagnosis not present

## 2017-10-30 DIAGNOSIS — M9901 Segmental and somatic dysfunction of cervical region: Secondary | ICD-10-CM | POA: Diagnosis not present

## 2017-10-30 DIAGNOSIS — M9902 Segmental and somatic dysfunction of thoracic region: Secondary | ICD-10-CM | POA: Diagnosis not present

## 2017-11-06 ENCOUNTER — Ambulatory Visit (INDEPENDENT_AMBULATORY_CARE_PROVIDER_SITE_OTHER): Payer: BLUE CROSS/BLUE SHIELD | Admitting: Primary Care

## 2017-11-06 ENCOUNTER — Encounter: Payer: Self-pay | Admitting: Primary Care

## 2017-11-06 ENCOUNTER — Other Ambulatory Visit: Payer: Self-pay | Admitting: Primary Care

## 2017-11-06 VITALS — BP 134/84 | HR 76 | Temp 98.1°F | Ht 66.0 in | Wt 192.5 lb

## 2017-11-06 DIAGNOSIS — R7303 Prediabetes: Secondary | ICD-10-CM

## 2017-11-06 DIAGNOSIS — Z1239 Encounter for other screening for malignant neoplasm of breast: Secondary | ICD-10-CM | POA: Diagnosis not present

## 2017-11-06 DIAGNOSIS — E559 Vitamin D deficiency, unspecified: Secondary | ICD-10-CM

## 2017-11-06 DIAGNOSIS — I10 Essential (primary) hypertension: Secondary | ICD-10-CM

## 2017-11-06 DIAGNOSIS — J309 Allergic rhinitis, unspecified: Secondary | ICD-10-CM

## 2017-11-06 DIAGNOSIS — N952 Postmenopausal atrophic vaginitis: Secondary | ICD-10-CM

## 2017-11-06 DIAGNOSIS — F329 Major depressive disorder, single episode, unspecified: Secondary | ICD-10-CM

## 2017-11-06 DIAGNOSIS — F419 Anxiety disorder, unspecified: Secondary | ICD-10-CM | POA: Diagnosis not present

## 2017-11-06 DIAGNOSIS — Z0000 Encounter for general adult medical examination without abnormal findings: Secondary | ICD-10-CM | POA: Diagnosis not present

## 2017-11-06 DIAGNOSIS — Z1283 Encounter for screening for malignant neoplasm of skin: Secondary | ICD-10-CM

## 2017-11-06 DIAGNOSIS — G43109 Migraine with aura, not intractable, without status migrainosus: Secondary | ICD-10-CM

## 2017-11-06 MED ORDER — LISINOPRIL 20 MG PO TABS: 20.0000 mg | ORAL_TABLET | Freq: Every day | ORAL | 3 refills | Status: DC

## 2017-11-06 MED ORDER — SERTRALINE HCL 50 MG PO TABS: 50.0000 mg | ORAL_TABLET | Freq: Every day | ORAL | 1 refills | Status: DC

## 2017-11-06 NOTE — Assessment & Plan Note (Signed)
Recent A1C within normal range.

## 2017-11-06 NOTE — Assessment & Plan Note (Signed)
Compliant to Zyrtec daily, continue same.

## 2017-11-06 NOTE — Assessment & Plan Note (Signed)
Compliant to vitamin D daily. Continue same.  

## 2017-11-06 NOTE — Assessment & Plan Note (Signed)
Tetanus vaccination UTD. Declines influenza vaccination. Pap smear UTD, due in 2020. Mammogram due, orders placed. Recommended to increase exercise, continue to work on a healthy diet. Commended her on weight loss efforts. Exam unremarkable. Labs reviewed. Follow up in 1 year for CPE.

## 2017-11-06 NOTE — Patient Instructions (Signed)
We've increased your lisinopril to 20 mg daily. I sent a new prescription to your pharmacy.  Call the [facility] to schedule your mammogram.  Apply ice to your leg as discussed. Continue taking Zyrtec. Also try Ibuprofen 400 mg 2-3 times daily for inflammation. Please update me if no improvement.  Continue exercising. You should be getting 150 minutes of moderate intensity exercise weekly.  Continue to eat a healthy diet.  Ensure you are consuming 64 ounces of water daily.  It was a pleasure to see you today!   Preventive Care 40-64 Years, Female Preventive care refers to lifestyle choices and visits with your health care provider that can promote health and wellness. What does preventive care include?  A yearly physical exam. This is also called an annual well check.  Dental exams once or twice a year.  Routine eye exams. Ask your health care provider how often you should have your eyes checked.  Personal lifestyle choices, including: ? Daily care of your teeth and gums. ? Regular physical activity. ? Eating a healthy diet. ? Avoiding tobacco and drug use. ? Limiting alcohol use. ? Practicing safe sex. ? Taking low-dose aspirin daily starting at age 17. ? Taking vitamin and mineral supplements as recommended by your health care provider. What happens during an annual well check? The services and screenings done by your health care provider during your annual well check will depend on your age, overall health, lifestyle risk factors, and family history of disease. Counseling Your health care provider may ask you questions about your:  Alcohol use.  Tobacco use.  Drug use.  Emotional well-being.  Home and relationship well-being.  Sexual activity.  Eating habits.  Work and work Statistician.  Method of birth control.  Menstrual cycle.  Pregnancy history.  Screening You may have the following tests or measurements:  Height, weight, and  BMI.  Blood pressure.  Lipid and cholesterol levels. These may be checked every 5 years, or more frequently if you are over 37 years old.  Skin check.  Lung cancer screening. You may have this screening every year starting at age 57 if you have a 30-pack-year history of smoking and currently smoke or have quit within the past 15 years.  Fecal occult blood test (FOBT) of the stool. You may have this test every year starting at age 39.  Flexible sigmoidoscopy or colonoscopy. You may have a sigmoidoscopy every 5 years or a colonoscopy every 10 years starting at age 11.  Hepatitis C blood test.  Hepatitis B blood test.  Sexually transmitted disease (STD) testing.  Diabetes screening. This is done by checking your blood sugar (glucose) after you have not eaten for a while (fasting). You may have this done every 1-3 years.  Mammogram. This may be done every 1-2 years. Talk to your health care provider about when you should start having regular mammograms. This may depend on whether you have a family history of breast cancer.  BRCA-related cancer screening. This may be done if you have a family history of breast, ovarian, tubal, or peritoneal cancers.  Pelvic exam and Pap test. This may be done every 3 years starting at age 78. Starting at age 36, this may be done every 5 years if you have a Pap test in combination with an HPV test.  Bone density scan. This is done to screen for osteoporosis. You may have this scan if you are at high risk for osteoporosis.  Discuss your test results, treatment  options, and if necessary, the need for more tests with your health care provider. Vaccines Your health care provider may recommend certain vaccines, such as:  Influenza vaccine. This is recommended every year.  Tetanus, diphtheria, and acellular pertussis (Tdap, Td) vaccine. You may need a Td booster every 10 years.  Varicella vaccine. You may need this if you have not been vaccinated.  Zoster  vaccine. You may need this after age 40.  Measles, mumps, and rubella (MMR) vaccine. You may need at least one dose of MMR if you were born in 1957 or later. You may also need a second dose.  Pneumococcal 13-valent conjugate (PCV13) vaccine. You may need this if you have certain conditions and were not previously vaccinated.  Pneumococcal polysaccharide (PPSV23) vaccine. You may need one or two doses if you smoke cigarettes or if you have certain conditions.  Meningococcal vaccine. You may need this if you have certain conditions.  Hepatitis A vaccine. You may need this if you have certain conditions or if you travel or work in places where you may be exposed to hepatitis A.  Hepatitis B vaccine. You may need this if you have certain conditions or if you travel or work in places where you may be exposed to hepatitis B.  Haemophilus influenzae type b (Hib) vaccine. You may need this if you have certain conditions.  Talk to your health care provider about which screenings and vaccines you need and how often you need them. This information is not intended to replace advice given to you by your health care provider. Make sure you discuss any questions you have with your health care provider. Document Released: 01/20/2015 Document Revised: 09/13/2015 Document Reviewed: 10/25/2014 Elsevier Interactive Patient Education  [employer].

## 2017-11-06 NOTE — Assessment & Plan Note (Signed)
Taking 50 mg daily. Has a stressful occupation. Overall doing well at 50 mg dose. Denies SI/HI. Continue Zoloft 50 mg.

## 2017-11-06 NOTE — Progress Notes (Signed)
Subjective:    Patient ID: Meagan Johnson, female    DOB: September 09, 1960, 57 y.o.   MRN: [id number]  HPI  Meagan Johnson is a 57 year old female who presents today for complete physical.  Immunizations: -Tetanus: Completed in 2014 -Influenza: Declines   Diet: She endorses a healthy diet Breakfast: Eggs, fruit Lunch: Salad, chicken, vegetables  Dinner: Skips sometimes, meat, vegetables, salad Snacks: [employer] protein bar Desserts: Once weekly Beverages: Water, coffee  Exercise: She is exercising at the gym once weekly  Eye exam: Completed in 2018 Dental exam: Completes semi-annually  Colonoscopy: Completed in 2011 Pap Smear: Completed in 2017 Mammogram: Completed in January 2018  BP Readings from Last 3 Encounters:  11/06/17 134/84  08/13/16 132/78  07/23/16 120/84    Wt Readings from Last 3 Encounters:  11/06/17 192 lb 8 oz (87.3 kg)  08/13/16 189 lb 12.8 oz (86.1 kg)  07/23/16 193 lb 12.8 oz (87.9 kg)     Review of Systems  Constitutional: Negative for unexpected weight change.  HENT: Negative for rhinorrhea.   Respiratory: Negative for cough and shortness of breath.   Cardiovascular: Negative for chest pain.  Gastrointestinal: Negative for constipation and diarrhea.  Genitourinary: Negative for difficulty urinating.  Musculoskeletal: Negative for arthralgias and myalgias.  Skin: Negative for rash.  Allergic/Immunologic: Negative for environmental allergies.  Neurological: Negative for dizziness, numbness and headaches.  Psychiatric/Behavioral:       Some anxiety with her occupation, overall feels well managed on Zoloft 50 mg.        Past Medical History:  Diagnosis Date  . Allergy   . Hypertension   . Migraine   . Postmenopausal bleeding 2014  . Vaginal atrophy      Social History   Socioeconomic History  . Marital status: Widowed    Spouse name: Not on file  . Number of children: Not on file  . Years of education: Not on file  . Highest education  level: Not on file  Occupational History  . Not on file  Social Needs  . Financial resource strain: Not on file  . Food insecurity:    Worry: Not on file    Inability: Not on file  . Transportation needs:    Medical: Not on file    Non-medical: Not on file  Tobacco Use  . Smoking status: Never Smoker  . Smokeless tobacco: Never Used  Substance and Sexual Activity  . Alcohol use: No    Alcohol/week: 0.0 standard drinks  . Drug use: No  . Sexual activity: Not Currently    Birth control/protection: Post-menopausal  Lifestyle  . Physical activity:    Days per week: Not on file    Minutes per session: Not on file  . Stress: Not on file  Relationships  . Social connections:    Talks on phone: Not on file    Gets together: Not on file    Attends religious service: Not on file    Active member of club or organization: Not on file    Attends meetings of clubs or organizations: Not on file    Relationship status: Not on file  . Intimate partner violence:    Fear of current or ex partner: Not on file    Emotionally abused: Not on file    Physically abused: Not on file    Forced sexual activity: Not on file  Other Topics Concern  . Not on file  Social History Narrative  . Not on file  Past Surgical History:  Procedure Laterality Date  . BREAST BIOPSY Left 1987  . DILATION AND CURETTAGE OF UTERUS  2014  . LASIK Bilateral     Family History  Problem Relation Age of Onset  . Alcohol abuse Mother   . Diabetes Mother   . Alcohol abuse Father   . Diabetes Father   . Diabetes Sister   . Drug abuse Brother   . Hypertension Brother   . Diabetes Brother   . Heart disease Maternal Grandmother   . Diabetes Maternal Grandmother   . Heart disease Maternal Grandfather   . Diabetes Maternal Grandfather   . Heart disease Paternal Grandmother   . Diabetes Paternal Grandmother   . Heart disease Paternal Grandfather   . Diabetes Paternal Grandfather   . Cancer Neg Hx     No  Known Allergies  Current Outpatient Medications on File Prior to Visit  Medication Sig Dispense Refill  . CALCIUM PO Take by mouth.    . cetirizine (ZYRTEC) 10 MG tablet TAKE 1 TABLET (10 MG TOTAL) BY MOUTH DAILY. 90 tablet 1  . Cholecalciferol (D3 ADULT PO) Take 1,000 Units by mouth daily.    . fluticasone (FLONASE) 50 MCG/ACT nasal spray Place 1 spray into both nostrils 2 (two) times daily. 16 g 11  . ibuprofen (ADVIL,MOTRIN) 800 MG tablet TAKE 1 TABLET (800 MG TOTAL) BY MOUTH 2 (TWO) TIMES DAILY AS NEEDED FOR MODERATE PAIN. 60 tablet 0  . Multiple Vitamin (MULTIVITAMIN) tablet Take 1 tablet by mouth daily.    Marland Kitchen triamcinolone cream (KENALOG) 0.1 % Apply 1 application topically 2 (two) times daily. 30 g 0   No current facility-administered medications on file prior to visit.     BP 134/84   Pulse 76   Temp 98.1 F (36.7 C) (Oral)   Ht 5\' 6"  (1.676 m)   Wt 192 lb 8 oz (87.3 kg)   SpO2 98%   BMI 31.07 kg/m    Objective:   Physical Exam  Constitutional: She is oriented to person, place, and time. She appears well-nourished.  HENT:  Mouth/Throat: No oropharyngeal exudate.  Eyes: Pupils are equal, round, and reactive to light. EOM are normal.  Neck: Neck supple. No thyromegaly present.  Cardiovascular: Normal rate and regular rhythm.  Respiratory: Effort normal and breath sounds normal.  GI: Soft. Bowel sounds are normal. There is no tenderness.  Musculoskeletal: Normal range of motion.  Neurological: She is alert and oriented to person, place, and time.  Skin: Skin is warm and dry.  Psychiatric: She has a normal mood and affect.           Assessment & Plan:

## 2017-11-06 NOTE — Assessment & Plan Note (Signed)
Has noticed increased stress with work. BP readings of 150/90's for 3-4 weeks, increased her lisinopril 20 mg for several weeks, has reduced down to 10 mg.   Will increase dose to 20 mg as she is borderline high in the office. She will continue to monitor. BMP unremarkable.

## 2017-11-06 NOTE — Assessment & Plan Note (Signed)
No longer using premarin cream as it did not provide improvement in hot flashes.

## 2017-11-06 NOTE — Assessment & Plan Note (Signed)
Infrequent overall. Increased with stress. She does plan on changing occupations after the new year as this is the source of a lot of her stress.

## 2018-01-22 DIAGNOSIS — L820 Inflamed seborrheic keratosis: Secondary | ICD-10-CM | POA: Diagnosis not present

## 2018-01-22 DIAGNOSIS — D229 Melanocytic nevi, unspecified: Secondary | ICD-10-CM | POA: Diagnosis not present

## 2018-01-22 DIAGNOSIS — Z85828 Personal history of other malignant neoplasm of skin: Secondary | ICD-10-CM | POA: Diagnosis not present

## 2018-01-22 DIAGNOSIS — Z1283 Encounter for screening for malignant neoplasm of skin: Secondary | ICD-10-CM | POA: Diagnosis not present

## 2018-01-22 DIAGNOSIS — L812 Freckles: Secondary | ICD-10-CM | POA: Diagnosis not present

## 2018-01-27 ENCOUNTER — Ambulatory Visit
Admission: RE | Admit: 2018-01-27 | Discharge: 2018-01-27 | Disposition: A | Discharge status: Home / Self Care | Payer: BLUE CROSS/BLUE SHIELD | Source: Ambulatory Visit | Attending: Primary Care | Admitting: Primary Care

## 2018-01-27 DIAGNOSIS — Z1239 Encounter for other screening for malignant neoplasm of breast: Secondary | ICD-10-CM | POA: Diagnosis not present

## 2018-01-27 DIAGNOSIS — Z1231 Encounter for screening mammogram for malignant neoplasm of breast: Secondary | ICD-10-CM | POA: Diagnosis not present

## 2018-01-29 ENCOUNTER — Other Ambulatory Visit: Payer: Self-pay | Admitting: Primary Care

## 2018-01-29 DIAGNOSIS — F419 Anxiety disorder, unspecified: Principal | ICD-10-CM

## 2018-01-29 DIAGNOSIS — F329 Major depressive disorder, single episode, unspecified: Secondary | ICD-10-CM

## 2018-01-29 NOTE — Telephone Encounter (Signed)
Last prescribed on 11/06/2017 but it was change to 1 tablet a day and before before that it was 1.5 tablet a day. Last office visit on 11/06/2017. No future appointment

## 2018-01-30 NOTE — Telephone Encounter (Signed)
Noted.  Refill sent to pharmacy. 

## 2018-06-04 ENCOUNTER — Other Ambulatory Visit: Payer: BLUE CROSS/BLUE SHIELD

## 2018-06-04 DIAGNOSIS — R350 Frequency of micturition: Secondary | ICD-10-CM | POA: Diagnosis not present

## 2018-06-04 DIAGNOSIS — N3000 Acute cystitis without hematuria: Secondary | ICD-10-CM | POA: Diagnosis not present

## 2018-06-04 LAB — POC URINALSYSI DIPSTICK (AUTOMATED)
Bilirubin, UA: NEGATIVE
Glucose, UA: NEGATIVE
Ketones, UA: NEGATIVE
Leukocytes, UA: NEGATIVE
Nitrite, UA: NEGATIVE
Protein, UA: NEGATIVE
Spec Grav, UA: 1.025 (ref 1.010–1.025)
Urobilinogen, UA: 0.2 E.U./dL
pH, UA: 6 (ref 5.0–8.0)

## 2018-06-05 ENCOUNTER — Ambulatory Visit (INDEPENDENT_AMBULATORY_CARE_PROVIDER_SITE_OTHER): Payer: BLUE CROSS/BLUE SHIELD | Admitting: Primary Care

## 2018-06-05 DIAGNOSIS — R350 Frequency of micturition: Secondary | ICD-10-CM | POA: Diagnosis not present

## 2018-06-05 MED ORDER — SULFAMETHOXAZOLE-TRIMETHOPRIM 800-160 MG PO TABS: 1.0000 | ORAL_TABLET | Freq: Two times a day (BID) | ORAL | 0 refills | Status: DC

## 2018-06-05 NOTE — Progress Notes (Signed)
Subjective:    Patient ID: Meagan Johnson, female    DOB: Jan 14, 1960, 58 y.o.   MRN: [id number]  HPI  Virtual Visit via Video Note  I connected with Meagan Johnson on 06/05/18 at  7:40 AM EDT by a video enabled telemedicine application and verified that I am speaking with the correct person using two identifiers.  Location: Patient: Home Provider: Office   I discussed the limitations of evaluation and management by telemedicine and the availability of in person appointments. The patient expressed understanding and agreed to proceed.  History of Present Illness:  [name] is a 58 year old female with a history of unstable bladder, urinary frequency, lower back pain, UTI who presents today with a chief complaint of dysuria.  She also reports lower back pain without radiation of pain. She has chronic urinary frequency. Denies hematuria but has noticed her urine being a darker/orange color. She denies fevers, abdominal pain, nausea. She admits that she has not been drinking much water lately. Symptoms began around 2 weeks ago and have not resolved.     Observations/Objective:  Alert and oriented. Appears well, not sickly. No distress. Speaking in complete sentences.   Assessment and Plan:  Symptoms suspicious for acute cystitis, especially given presence of blood with lack of symptoms to suggest renal stones. Urine with 3+ blood, negative leuks and nitrites. Culture sent and pending. Strongly advised she increase her intake of water. Rx for Bactrim Ds tablets sent to pharmacy.  She will update if no improvement.   Follow Up Instructions:  Start Bactrim DS (sulfamethoxazole/trimethoprim) tablets for urinary tract infection. Take 1 tablet by mouth twice daily for 5 days.  Ensure you are consuming 64 ounces of water daily.  Please update me if no improvement in 3-4 days.  It was a pleasure to see you today! Mayra ReelKate Yuya Vanwingerden, NP-C    I discussed the assessment and  treatment plan with the patient. The patient was provided an opportunity to ask questions and all were answered. The patient agreed with the plan and demonstrated an understanding of the instructions.   The patient was advised to call back or seek an in-person evaluation if the symptoms worsen or if the condition fails to improve as anticipated.    Doreene NestKatherine K Blessed Girdner, NP    Review of Systems  Constitutional: Negative for fever.  Gastrointestinal: Negative for abdominal pain and nausea.  Genitourinary: Positive for dysuria and frequency. Negative for pelvic pain, vaginal bleeding and vaginal discharge.       Darker orange urine color       Past Medical History:  Diagnosis Date  . Allergy   . Hypertension   . Migraine   . Postmenopausal bleeding 2014  . Vaginal atrophy      Social History   Socioeconomic History  . Marital status: Widowed    Spouse name: Not on file  . Number of children: Not on file  . Years of education: Not on file  . Highest education level: Not on file  Occupational History  . Not on file  Social Needs  . Financial resource strain: Not on file  . Food insecurity:    Worry: Not on file    Inability: Not on file  . Transportation needs:    Medical: Not on file    Non-medical: Not on file  Tobacco Use  . Smoking status: Never Smoker  . Smokeless tobacco: Never Used  Substance and Sexual Activity  . Alcohol use: No  Alcohol/week: 0.0 standard drinks  . Drug use: No  . Sexual activity: Not Currently    Birth control/protection: Post-menopausal  Lifestyle  . Physical activity:    Days per week: Not on file    Minutes per session: Not on file  . Stress: Not on file  Relationships  . Social connections:    Talks on phone: Not on file    Gets together: Not on file    Attends religious service: Not on file    Active member of club or organization: Not on file    Attends meetings of clubs or organizations: Not on file    Relationship status:  Not on file  . Intimate partner violence:    Fear of current or ex partner: Not on file    Emotionally abused: Not on file    Physically abused: Not on file    Forced sexual activity: Not on file  Other Topics Concern  . Not on file  Social History Narrative  . Not on file    Past Surgical History:  Procedure Laterality Date  . BREAST BIOPSY Left 1987   neg  . DILATION AND CURETTAGE OF UTERUS  2014  . LASIK Bilateral     Family History  Problem Relation Age of Onset  . Alcohol abuse Mother   . Diabetes Mother   . Alcohol abuse Father   . Diabetes Father   . Diabetes Sister   . Drug abuse Brother   . Hypertension Brother   . Diabetes Brother   . Heart disease Maternal Grandmother   . Diabetes Maternal Grandmother   . Heart disease Maternal Grandfather   . Diabetes Maternal Grandfather   . Heart disease Paternal Grandmother   . Diabetes Paternal Grandmother   . Heart disease Paternal Grandfather   . Diabetes Paternal Grandfather   . Cancer Neg Hx   . Breast cancer Neg Hx     No Known Allergies  Current Outpatient Medications on File Prior to Visit  Medication Sig Dispense Refill  . CALCIUM PO Take by mouth.    . cetirizine (ZYRTEC) 10 MG tablet TAKE 1 TABLET (10 MG TOTAL) BY MOUTH DAILY. 90 tablet 1  . Cholecalciferol (D3 ADULT PO) Take 1,000 Units by mouth daily.    . fluticasone (FLONASE) 50 MCG/ACT nasal spray Place 1 spray into both nostrils 2 (two) times daily. 16 g 11  . ibuprofen (ADVIL,MOTRIN) 800 MG tablet TAKE 1 TABLET (800 MG TOTAL) BY MOUTH 2 (TWO) TIMES DAILY AS NEEDED FOR MODERATE PAIN. 60 tablet 0  . lisinopril (PRINIVIL,ZESTRIL) 20 MG tablet Take 1 tablet (20 mg total) by mouth daily. For blood pressure. 90 tablet 3  . Multiple Vitamin (MULTIVITAMIN) tablet Take 1 tablet by mouth daily.    . sertraline (ZOLOFT) 50 MG tablet Take 1 tablet (50 mg total) by mouth daily. 90 tablet 2  . triamcinolone cream (KENALOG) 0.1 % Apply 1 application topically 2  (two) times daily. 30 g 0   No current facility-administered medications on file prior to visit.     There were no vitals taken for this visit.   Objective:   Physical Exam  Constitutional: She is oriented to person, place, and time. She appears well-nourished. She does not have a sickly appearance. She does not appear ill.  Respiratory: Effort normal. No respiratory distress.  Neurological: She is alert and oriented to person, place, and time.           Assessment & Plan:

## 2018-06-05 NOTE — Assessment & Plan Note (Signed)
Symptoms suspicious for acute cystitis, especially given presence of blood with lack of symptoms to suggest renal stones. Urine with 3+ blood, negative leuks and nitrites. Culture sent and pending. Strongly advised she increase her intake of water. Rx for Bactrim Ds tablets sent to pharmacy.  She will update if no improvement.

## 2018-06-05 NOTE — Patient Instructions (Signed)
  Start Bactrim DS (sulfamethoxazole/trimethoprim) tablets for urinary tract infection. Take 1 tablet by mouth twice daily for 5 days.  Ensure you are consuming 64 ounces of water daily.  Please update me if no improvement in 3-4 days.  It was a pleasure to see you today! Mayra Reel, NP-C

## 2018-06-06 LAB — URINE CULTURE
MICRO NUMBER:: 514638
SPECIMEN QUALITY:: ADEQUATE

## 2018-06-17 ENCOUNTER — Other Ambulatory Visit (INDEPENDENT_AMBULATORY_CARE_PROVIDER_SITE_OTHER): Payer: BLUE CROSS/BLUE SHIELD

## 2018-06-17 DIAGNOSIS — R350 Frequency of micturition: Secondary | ICD-10-CM

## 2018-06-17 DIAGNOSIS — N3001 Acute cystitis with hematuria: Secondary | ICD-10-CM | POA: Diagnosis not present

## 2018-06-17 LAB — POC URINALSYSI DIPSTICK (AUTOMATED)
Bilirubin, UA: NEGATIVE
Glucose, UA: NEGATIVE
Ketones, UA: NEGATIVE
Leukocytes, UA: NEGATIVE
Nitrite, UA: NEGATIVE
Protein, UA: NEGATIVE
Spec Grav, UA: 1.015 (ref 1.010–1.025)
Urobilinogen, UA: 0.2 E.U./dL
pH, UA: 6 (ref 5.0–8.0)

## 2018-06-19 ENCOUNTER — Other Ambulatory Visit: Payer: Self-pay | Admitting: Primary Care

## 2018-06-19 DIAGNOSIS — N3001 Acute cystitis with hematuria: Secondary | ICD-10-CM

## 2018-06-19 LAB — URINE CULTURE
MICRO NUMBER:: 555947
SPECIMEN QUALITY:: ADEQUATE

## 2018-06-19 MED ORDER — CEPHALEXIN 500 MG PO CAPS: 500.0000 mg | ORAL_CAPSULE | Freq: Two times a day (BID) | ORAL | 0 refills | Status: AC

## 2018-08-04 ENCOUNTER — Telehealth (INDEPENDENT_AMBULATORY_CARE_PROVIDER_SITE_OTHER): Payer: BC Managed Care – PPO | Admitting: Primary Care

## 2018-08-04 ENCOUNTER — Other Ambulatory Visit: Payer: Self-pay

## 2018-08-04 DIAGNOSIS — E559 Vitamin D deficiency, unspecified: Secondary | ICD-10-CM | POA: Diagnosis not present

## 2018-08-04 DIAGNOSIS — R7303 Prediabetes: Secondary | ICD-10-CM

## 2018-08-04 DIAGNOSIS — R5383 Other fatigue: Secondary | ICD-10-CM | POA: Diagnosis not present

## 2018-08-04 NOTE — Patient Instructions (Signed)
Call the main line to schedule a lab only appointment as discussed.  Start exercising. You should be getting 150 minutes of moderate intensity exercise weekly.  Continue to work on a healthy diet.   It was a pleasure to see you today!

## 2018-08-04 NOTE — Progress Notes (Signed)
Subjective:    Patient ID: Meagan Johnson, female    DOB: [birth year], 58 y.o.   MRN: [id number]  HPI  Virtual Visit via Video Note  I connected with Meagan Johnson on 08/04/18 at  8:40 AM EDT by a video enabled telemedicine application and verified that I am speaking with the correct person using two identifiers.  Location: Patient: Work Provider: Office   I discussed the limitations of evaluation and management by telemedicine and the availability of in person appointments. The patient expressed understanding and agreed to proceed.  History of Present Illness:  Meagan Johnson is a 58 year old female with a history of hypertension, migraines, prediabetes, anxiety and depression, acute cystitis who presents today with a chief complaint of hair thinning.  Symptoms of hair thinning, dry and brittle hair, daytime tiredness, little energy, fatigue that has been present for the last 6 months, worse over the last 2-3 months. She's noticed a few small bald areas recently. She really feels tired when at work looking at the computer, when driving home and could fall asleep during those scenarios. She will go to bed around 9 pm, wakes up at 4:30 am. She lives alone so she's not sure if she snores. She denies cold intolerance, she wakes feeling rested.    She is not taking vitamin D consistently. Overall feels better on Zoloft and is back up to 50 mg. She believes that if she didn't live alone and had friends to talk with then her anxiety would be reduced.    Observations/Objective:  Alert and oriented. Appears well, not sickly. No distress. Speaking in complete sentences.   Assessment and Plan:  See problem based charting.  Follow Up Instructions:  Call the main line to schedule a lab only appointment as discussed.  Start exercising. You should be getting 150 minutes of moderate intensity exercise weekly.  Continue to work on a healthy diet.   It was a pleasure to see you today!     I discussed the assessment and treatment plan with the patient. The patient was provided an opportunity to ask questions and all were answered. The patient agreed with the plan and demonstrated an understanding of the instructions.   The patient was advised to call back or seek an in-person evaluation if the symptoms worsen or if the condition fails to improve as anticipated.     Pleas Koch, NP    Review of Systems  Constitutional: Positive for fatigue.  Respiratory: Negative for shortness of breath.   Cardiovascular: Negative for palpitations.  Skin:       Hair loss, dry skin  Neurological: Negative for dizziness and headaches.       Past Medical History:  Diagnosis Date  . Allergy   . Hypertension   . Migraine   . Postmenopausal bleeding 2014  . Vaginal atrophy      Social History   Socioeconomic History  . Marital status: Widowed    Spouse name: Not on file  . Number of children: Not on file  . Years of education: Not on file  . Highest education level: Not on file  Occupational History  . Not on file  Social Needs  . Financial resource strain: Not on file  . Food insecurity    Worry: Not on file    Inability: Not on file  . Transportation needs    Medical: Not on file    Non-medical: Not on file  Tobacco Use  . Smoking status: Never  Smoker  . Smokeless tobacco: Never Used  Substance and Sexual Activity  . Alcohol use: No    Alcohol/week: 0.0 standard drinks  . Drug use: No  . Sexual activity: Not Currently    Birth control/protection: Post-menopausal  Lifestyle  . Physical activity    Days per week: Not on file    Minutes per session: Not on file  . Stress: Not on file  Relationships  . Social Musicianconnections    Talks on phone: Not on file    Gets together: Not on file    Attends religious service: Not on file    Active member of club or organization: Not on file    Attends meetings of clubs or organizations: Not on file     Relationship status: Not on file  . Intimate partner violence    Fear of current or ex partner: Not on file    Emotionally abused: Not on file    Physically abused: Not on file    Forced sexual activity: Not on file  Other Topics Concern  . Not on file  Social History Narrative  . Not on file    Past Surgical History:  Procedure Laterality Date  . BREAST BIOPSY Left 1987   neg  . DILATION AND CURETTAGE OF UTERUS  2014  . LASIK Bilateral     Family History  Problem Relation Age of Onset  . Alcohol abuse Mother   . Diabetes Mother   . Alcohol abuse Father   . Diabetes Father   . Diabetes Sister   . Drug abuse Brother   . Hypertension Brother   . Diabetes Brother   . Heart disease Maternal Grandmother   . Diabetes Maternal Grandmother   . Heart disease Maternal Grandfather   . Diabetes Maternal Grandfather   . Heart disease Paternal Grandmother   . Diabetes Paternal Grandmother   . Heart disease Paternal Grandfather   . Diabetes Paternal Grandfather   . Cancer Neg Hx   . Breast cancer Neg Hx     No Known Allergies  Current Outpatient Medications on File Prior to Visit  Medication Sig Dispense Refill  . CALCIUM PO Take by mouth.    . cetirizine (ZYRTEC) 10 MG tablet TAKE 1 TABLET (10 MG TOTAL) BY MOUTH DAILY. 90 tablet 1  . Cholecalciferol (D3 ADULT PO) Take 1,000 Units by mouth daily.    . fluticasone (FLONASE) 50 MCG/ACT nasal spray Place 1 spray into both nostrils 2 (two) times daily. 16 g 11  . ibuprofen (ADVIL,MOTRIN) 800 MG tablet TAKE 1 TABLET (800 MG TOTAL) BY MOUTH 2 (TWO) TIMES DAILY AS NEEDED FOR MODERATE PAIN. 60 tablet 0  . lisinopril (PRINIVIL,ZESTRIL) 20 MG tablet Take 1 tablet (20 mg total) by mouth daily. For blood pressure. 90 tablet 3  . Multiple Vitamin (MULTIVITAMIN) tablet Take 1 tablet by mouth daily.    . sertraline (ZOLOFT) 50 MG tablet Take 1 tablet (50 mg total) by mouth daily. 90 tablet 2  . sulfamethoxazole-trimethoprim (BACTRIM DS)  800-160 MG tablet Take 1 tablet by mouth 2 (two) times daily. For urinary tract infection. 10 tablet 0  . triamcinolone cream (KENALOG) 0.1 % Apply 1 application topically 2 (two) times daily. 30 g 0   No current facility-administered medications on file prior to visit.     There were no vitals taken for this visit.   Objective:   Physical Exam  Constitutional: She is oriented to person, place, and time. She does not have  a sickly appearance. She does not appear ill.  Respiratory: Effort normal.  Neurological: She is alert and oriented to person, place, and time.  Psychiatric: She has a normal mood and affect.           Assessment & Plan:

## 2018-08-04 NOTE — Assessment & Plan Note (Signed)
Non compliant to vitamin D daily, repeat levels pending.

## 2018-08-04 NOTE — Assessment & Plan Note (Signed)
Chronic for 6 months, worse over last several months. Differentials include thyroid dysfunction, anemia, anxiety, diabetes, sleep apnea, deconditioning, depression, etc.  Start with lab work up as noted above. Consider sleep apnea work up if labs without obvious cause. She agrees.  Await results. Encouraged regular exercise.

## 2018-08-04 NOTE — Assessment & Plan Note (Signed)
Repeat A1C pending. 

## 2018-08-06 ENCOUNTER — Other Ambulatory Visit (INDEPENDENT_AMBULATORY_CARE_PROVIDER_SITE_OTHER): Payer: BC Managed Care – PPO

## 2018-08-06 DIAGNOSIS — R5383 Other fatigue: Secondary | ICD-10-CM

## 2018-08-06 DIAGNOSIS — E559 Vitamin D deficiency, unspecified: Secondary | ICD-10-CM | POA: Diagnosis not present

## 2018-08-06 DIAGNOSIS — R7303 Prediabetes: Secondary | ICD-10-CM

## 2018-08-07 ENCOUNTER — Other Ambulatory Visit: Payer: BC Managed Care – PPO

## 2018-08-07 LAB — CBC
HCT: 42.3 % (ref 36.0–46.0)
Hemoglobin: 14 g/dL (ref 12.0–15.0)
MCHC: 33.1 g/dL (ref 30.0–36.0)
MCV: 92 fl (ref 78.0–100.0)
Platelets: 228 10*3/uL (ref 150.0–400.0)
RBC: 4.6 Mil/uL (ref 3.87–5.11)
RDW: 13 % (ref 11.5–15.5)
WBC: 9.4 10*3/uL (ref 4.0–10.5)

## 2018-08-07 LAB — VITAMIN B12: Vitamin B-12: 603 pg/mL (ref 211–911)

## 2018-08-07 LAB — COMPREHENSIVE METABOLIC PANEL
ALT: 15 U/L (ref 0–35)
AST: 18 U/L (ref 0–37)
Albumin: 4.2 g/dL (ref 3.5–5.2)
Alkaline Phosphatase: 63 U/L (ref 39–117)
BUN: 29 mg/dL — ABNORMAL HIGH (ref 6–23)
CO2: 28 mEq/L (ref 19–32)
Calcium: 9.6 mg/dL (ref 8.4–10.5)
Chloride: 107 mEq/L (ref 96–112)
Creatinine, Ser: 1.02 mg/dL (ref 0.40–1.20)
GFR: 55.32 mL/min — ABNORMAL LOW (ref >60.00)
Glucose, Bld: 90 mg/dL (ref 70–99)
Potassium: 3.9 mEq/L (ref 3.5–5.1)
Sodium: 141 mEq/L (ref 135–145)
Total Bilirubin: 0.3 mg/dL (ref 0.2–1.2)
Total Protein: 7.2 g/dL (ref 6.0–8.3)

## 2018-08-07 LAB — HEMOGLOBIN A1C: Hgb A1c MFr Bld: 5.7 % (ref 4.6–6.5)

## 2018-08-07 LAB — VITAMIN D 25 HYDROXY (VIT D DEFICIENCY, FRACTURES): VITD: 26.36 ng/mL — ABNORMAL LOW (ref 30.00–100.00)

## 2018-08-07 LAB — TSH: TSH: 1.66 u[IU]/mL (ref 0.35–4.50)

## 2018-09-02 ENCOUNTER — Encounter: Payer: Self-pay | Admitting: Primary Care

## 2018-09-19 DIAGNOSIS — R350 Frequency of micturition: Secondary | ICD-10-CM

## 2018-09-19 DIAGNOSIS — R5383 Other fatigue: Secondary | ICD-10-CM

## 2018-09-23 ENCOUNTER — Other Ambulatory Visit: Payer: Self-pay | Admitting: Primary Care

## 2018-09-23 DIAGNOSIS — I10 Essential (primary) hypertension: Secondary | ICD-10-CM

## 2018-09-27 DIAGNOSIS — F419 Anxiety disorder, unspecified: Secondary | ICD-10-CM

## 2018-09-27 DIAGNOSIS — F329 Major depressive disorder, single episode, unspecified: Secondary | ICD-10-CM

## 2018-09-28 MED ORDER — SERTRALINE HCL 50 MG PO TABS: 50.0000 mg | ORAL_TABLET | Freq: Every day | ORAL | 0 refills | Status: DC

## 2018-10-01 ENCOUNTER — Other Ambulatory Visit (INDEPENDENT_AMBULATORY_CARE_PROVIDER_SITE_OTHER): Payer: BC Managed Care – PPO

## 2018-10-01 ENCOUNTER — Other Ambulatory Visit: Payer: Self-pay | Admitting: *Deleted

## 2018-10-01 DIAGNOSIS — Z87440 Personal history of urinary (tract) infections: Secondary | ICD-10-CM | POA: Diagnosis not present

## 2018-10-01 DIAGNOSIS — R5383 Other fatigue: Secondary | ICD-10-CM | POA: Diagnosis not present

## 2018-10-01 DIAGNOSIS — I10 Essential (primary) hypertension: Secondary | ICD-10-CM

## 2018-10-01 DIAGNOSIS — R350 Frequency of micturition: Secondary | ICD-10-CM

## 2018-10-01 LAB — POC URINALSYSI DIPSTICK (AUTOMATED)
Bilirubin, UA: NEGATIVE
Glucose, UA: NEGATIVE
Ketones, UA: NEGATIVE
Leukocytes, UA: NEGATIVE
Nitrite, UA: NEGATIVE
Protein, UA: NEGATIVE
Spec Grav, UA: 1.015 (ref 1.010–1.025)
Urobilinogen, UA: 0.2 E.U./dL
pH, UA: 6 (ref 5.0–8.0)

## 2018-10-01 NOTE — Addendum Note (Signed)
Addended by: Jacqualin Combes on: 10/01/2018 04:26 PM   Modules accepted: Orders

## 2018-10-02 DIAGNOSIS — N289 Disorder of kidney and ureter, unspecified: Secondary | ICD-10-CM

## 2018-10-02 LAB — BASIC METABOLIC PANEL
BUN: 20 mg/dL (ref 6–23)
CO2: 28 mEq/L (ref 19–32)
Calcium: 9.8 mg/dL (ref 8.4–10.5)
Chloride: 103 mEq/L (ref 96–112)
Creatinine, Ser: 1.05 mg/dL (ref 0.40–1.20)
GFR: 53.47 mL/min — ABNORMAL LOW (ref >60.00)
Glucose, Bld: 90 mg/dL (ref 70–99)
Potassium: 3.8 mEq/L (ref 3.5–5.1)
Sodium: 138 mEq/L (ref 135–145)

## 2018-10-02 LAB — URINE CULTURE
MICRO NUMBER:: 918642
SPECIMEN QUALITY:: ADEQUATE

## 2018-10-02 LAB — T4, FREE: Free T4: 0.84 ng/dL (ref 0.60–1.60)

## 2018-10-02 LAB — TSH: TSH: 1.68 u[IU]/mL (ref 0.35–4.50)

## 2018-10-02 LAB — T3, FREE: T3, Free: 3.2 pg/mL (ref 2.3–4.2)

## 2018-10-08 ENCOUNTER — Other Ambulatory Visit: Payer: BC Managed Care – PPO

## 2018-10-14 ENCOUNTER — Ambulatory Visit (INDEPENDENT_AMBULATORY_CARE_PROVIDER_SITE_OTHER): Payer: BC Managed Care – PPO

## 2018-10-14 ENCOUNTER — Other Ambulatory Visit: Payer: Self-pay

## 2018-10-14 ENCOUNTER — Other Ambulatory Visit: Payer: BC Managed Care – PPO

## 2018-10-14 DIAGNOSIS — N289 Disorder of kidney and ureter, unspecified: Secondary | ICD-10-CM | POA: Diagnosis not present

## 2018-10-14 DIAGNOSIS — R944 Abnormal results of kidney function studies: Secondary | ICD-10-CM | POA: Diagnosis not present

## 2018-10-17 DIAGNOSIS — N289 Disorder of kidney and ureter, unspecified: Secondary | ICD-10-CM

## 2018-11-03 ENCOUNTER — Other Ambulatory Visit: Payer: Self-pay

## 2018-11-03 ENCOUNTER — Other Ambulatory Visit (HOSPITAL_COMMUNITY)
Admission: RE | Admit: 2018-11-03 | Discharge: 2018-11-03 | Disposition: A | Discharge status: Home / Self Care | Payer: BC Managed Care – PPO | Source: Ambulatory Visit | Attending: Obstetrics and Gynecology | Admitting: Obstetrics and Gynecology

## 2018-11-03 ENCOUNTER — Encounter: Payer: Self-pay | Admitting: Obstetrics and Gynecology

## 2018-11-03 ENCOUNTER — Ambulatory Visit (INDEPENDENT_AMBULATORY_CARE_PROVIDER_SITE_OTHER): Payer: BC Managed Care – PPO | Admitting: Obstetrics and Gynecology

## 2018-11-03 VITALS — BP 143/77 | HR 101 | Ht 66.0 in | Wt 207.4 lb

## 2018-11-03 DIAGNOSIS — Z01419 Encounter for gynecological examination (general) (routine) without abnormal findings: Secondary | ICD-10-CM

## 2018-11-03 DIAGNOSIS — Z124 Encounter for screening for malignant neoplasm of cervix: Secondary | ICD-10-CM | POA: Diagnosis not present

## 2018-11-03 DIAGNOSIS — R300 Dysuria: Secondary | ICD-10-CM | POA: Diagnosis not present

## 2018-11-03 LAB — POCT URINALYSIS DIPSTICK OB
Bilirubin, UA: NEGATIVE
Glucose, UA: NEGATIVE
Ketones, UA: NEGATIVE
Leukocytes, UA: NEGATIVE
Nitrite, UA: NEGATIVE
POC,PROTEIN,UA: NEGATIVE
Spec Grav, UA: 1.01 (ref 1.010–1.025)
Urobilinogen, UA: 0.2 E.U./dL
pH, UA: 7 (ref 5.0–8.0)

## 2018-11-03 NOTE — Progress Notes (Signed)
HPI:      [name] is a 58 y.o. [phone] who LMP was No LMP recorded. Patient is postmenopausal.  Subjective:   She presents today for her annual examination.  She complains of occasional burning with urination.  She states in the last year she has had 2 new sexual partners and would like to be tested for STDs.  She denies vaginal discharge or other symptoms other than the burning with urination.  She is menopausal not on HRT.  She is no longer using Premarin vaginal cream. She is up-to-date on mammography and gets her routine annual blood work through her PCP. Of significant note she was previously diagnosed with a cystocele but she says that as she has begun to "use her vagina more and exercise the muscle there" her symptoms have improved.    Hx: The following portions of the patient's history were reviewed and updated as appropriate:             She  has a past medical history of Allergy, Hypertension, Migraine, Postmenopausal bleeding (2014), and Vaginal atrophy. She does not have any pertinent problems on file. She  has a past surgical history that includes Dilation and curettage of uterus (2014); LASIK (Bilateral); and Breast biopsy (Left, 1987). Her family history includes Alcohol abuse in her father and mother; Diabetes in her brother, father, maternal grandfather, maternal grandmother, mother, paternal grandfather, paternal grandmother, and sister; Drug abuse in her brother; Heart disease in her maternal grandfather, maternal grandmother, paternal grandfather, and paternal grandmother; Hypertension in her brother. She  reports that she has never smoked. She has never used smokeless tobacco. She reports that she does not drink alcohol or use drugs. She has a current medication list which includes the following prescription(s): cetirizine, cholecalciferol, lisinopril, multivitamin, sertraline, calcium, fluticasone, ibuprofen, sulfamethoxazole-trimethoprim, and triamcinolone  cream. She has No Known Allergies.       Review of Systems:  Review of Systems  Constitutional: Denied constitutional symptoms, night sweats, recent illness, fatigue, fever, insomnia and weight loss.  Eyes: Denied eye symptoms, eye pain, photophobia, vision change and visual disturbance.  Ears/Nose/Throat/Neck: Denied ear, nose, throat or neck symptoms, hearing loss, nasal discharge, sinus congestion and sore throat.  Cardiovascular: Denied cardiovascular symptoms, arrhythmia, chest pain/pressure, edema, exercise intolerance, orthopnea and palpitations.  Respiratory: Denied pulmonary symptoms, asthma, pleuritic pain, productive sputum, cough, dyspnea and wheezing.  Gastrointestinal: Denied, gastro-esophageal reflux, melena, nausea and vomiting.  Genitourinary: Denied genitourinary symptoms including symptomatic vaginal discharge, pelvic relaxation issues, and urinary complaints.  Musculoskeletal: Denied musculoskeletal symptoms, stiffness, swelling, muscle weakness and myalgia.  Dermatologic: Denied dermatology symptoms, rash and scar.  Neurologic: Denied neurology symptoms, dizziness, headache, neck pain and syncope.  Psychiatric: Denied psychiatric symptoms, anxiety and depression.  Endocrine: Denied endocrine symptoms including hot flashes and night sweats.   Meds:   Current Outpatient Medications on File Prior to Visit  Medication Sig Dispense Refill  . cetirizine (ZYRTEC) 10 MG tablet TAKE 1 TABLET (10 MG TOTAL) BY MOUTH DAILY. 90 tablet 1  . Cholecalciferol (D3 ADULT PO) Take 1,000 Units by mouth daily.    Marland Kitchen. lisinopril (PRINIVIL,ZESTRIL) 20 MG tablet Take 1 tablet (20 mg total) by mouth daily. For blood pressure. 90 tablet 3  . Multiple Vitamin (MULTIVITAMIN) tablet Take 1 tablet by mouth daily.    . sertraline (ZOLOFT) 50 MG tablet Take 1 tablet (50 mg total) by mouth daily. 90 tablet 0  . CALCIUM PO Take by mouth.    . fluticasone (FLONASE)  50 MCG/ACT nasal spray Place 1 spray  into both nostrils 2 (two) times daily. (Patient not taking: Reported on 11/03/2018) 16 g 11  . ibuprofen (ADVIL,MOTRIN) 800 MG tablet TAKE 1 TABLET (800 MG TOTAL) BY MOUTH 2 (TWO) TIMES DAILY AS NEEDED FOR MODERATE PAIN. (Patient not taking: Reported on 11/03/2018) 60 tablet 0  . sulfamethoxazole-trimethoprim (BACTRIM DS) 800-160 MG tablet Take 1 tablet by mouth 2 (two) times daily. For urinary tract infection. (Patient not taking: Reported on 11/03/2018) 10 tablet 0  . triamcinolone cream (KENALOG) 0.1 % Apply 1 application topically 2 (two) times daily. (Patient not taking: Reported on 11/03/2018) 30 g 0   No current facility-administered medications on file prior to visit.     Objective:     Vitals:   11/03/18 1519  BP: (!) 143/77  Pulse: (!) 101   Physical examination General NAD, Conversant  HEENT Atraumatic; Op clear with mmm.  Normo-cephalic. Pupils reactive. Anicteric sclerae  Thyroid/Neck Smooth without nodularity or enlargement. Normal ROM.  Neck Supple.  Skin No rashes, lesions or ulceration. Normal palpated skin turgor. No nodularity.  Breasts: No masses or discharge.  Symmetric.  No axillary adenopathy.  Lungs: Clear to auscultation.No rales or wheezes. Normal Respiratory effort, no retractions.  Heart: NSR.  No murmurs or rubs appreciated. No periferal edema  Abdomen: Soft.  Non-tender.  No masses.  No HSM. No hernia  Extremities: Moves all appropriately.  Normal ROM for age. No lymphadenopathy.  Neuro: Oriented to PPT.  Normal mood. Normal affect.     Pelvic:   Vulva: Normal appearance.  No lesions.  Vagina: No lesions or abnormalities noted.  Support:  Second-degree midline cystocele  Urethra No masses tenderness or scarring.  Meatus Normal size without lesions or prolapse.  Cervix: Normal appearance.  No lesions.  Anus: Normal exam.  No lesions.  Perineum: Normal exam.  No lesions.        Bimanual   Uterus: Normal size.  Non-tender.  Mobile.  AV.  Adnexae: No  masses.  Non-tender to palpation.  Cul-de-sac: Negative for abnormality.      Assessment:    [id number] Patient Active Problem List   Diagnosis Date Noted  . Fatigue 08/04/2018  . Rash and nonspecific skin eruption 08/13/2016  . Chronic right shoulder pain 01/09/2016  . Cystocele, midline 10/18/2015  . Anxiety and depression 09/27/2015  . Borderline diabetes 03/29/2015  . Vitamin D deficiency 03/29/2015  . Rhinitis, allergic 03/29/2015  . Preventative health care 03/29/2015  . Low back pain 03/29/2015  . Cystocele 03/08/2015  . Increased BMI 03/08/2015  . Vaginal atrophy 03/08/2015  . Urinary frequency 03/08/2015  . Unstable bladder 03/08/2015  . Essential hypertension 08/25/2014  . Migraines 08/25/2014  . Plantar fasciitis 08/25/2014     1. Burning with urination   2. Well woman exam with routine gynecological exam    Patient with some new sexual partners and desires STD testing.   Plan:            1.  Basic Screening Recommendations The basic screening recommendations for asymptomatic women were discussed with the patient during her visit.  The age-appropriate recommendations were discussed with her and the rational for the tests reviewed.  When I am informed by the patient that another primary care physician has previously obtained the age-appropriate tests and they are up-to-date, only outstanding tests are ordered and referrals given as necessary.  Abnormal results of tests will be discussed with her when all of her results are  completed.  Routine preventative health maintenance measures emphasized: Exercise/Diet/Weight control, Tobacco Warnings, Alcohol/Substance use risks and Stress Management Pap performed-STD testing performed. Orders Orders Placed This Encounter  Procedures  . POC Urinalysis Dipstick OB    No orders of the defined types were placed in this encounter.       F/U  Return in about 1 year (around 11/03/2019) for Annual Physical, We will contact  her with any abnormal test results.  Elonda Husky, M.D. 11/03/2018 4:08 PM

## 2018-11-03 NOTE — Addendum Note (Signed)
Addended by: Durwin Glaze on: 11/03/2018 04:21 PM   Modules accepted: Orders

## 2018-11-03 NOTE — Progress Notes (Signed)
Patient comes in today for yearly physical. She is due for Pap today.

## 2018-11-05 NOTE — Telephone Encounter (Signed)
Charmaine, could you look into this?

## 2018-11-10 LAB — CYTOLOGY - PAP
Chlamydia: NEGATIVE
Comment: NEGATIVE
Comment: NORMAL
Diagnosis: NEGATIVE
Neisseria Gonorrhea: NEGATIVE

## 2018-11-23 ENCOUNTER — Other Ambulatory Visit: Payer: Self-pay | Admitting: Primary Care

## 2018-11-23 DIAGNOSIS — I10 Essential (primary) hypertension: Secondary | ICD-10-CM

## 2018-11-23 DIAGNOSIS — N3289 Other specified disorders of bladder: Secondary | ICD-10-CM

## 2018-11-23 DIAGNOSIS — R300 Dysuria: Secondary | ICD-10-CM

## 2018-11-23 DIAGNOSIS — N289 Disorder of kidney and ureter, unspecified: Secondary | ICD-10-CM

## 2018-11-26 NOTE — Telephone Encounter (Signed)
Please notify patient that we need to get her scheduled for general follow up of her medications and BP check. Will you see if she can do this? CPE is fine unless she had this at her GYN's office.

## 2018-11-26 NOTE — Telephone Encounter (Signed)
Last prescribed on 11/06/2017 #90 with 3 refills . Last appointment on 08/04/2018 for acute and last cpe on 11/06/2017. Nofuture appointment

## 2018-11-29 ENCOUNTER — Other Ambulatory Visit: Payer: Self-pay | Admitting: Primary Care

## 2018-11-29 DIAGNOSIS — N390 Urinary tract infection, site not specified: Secondary | ICD-10-CM

## 2018-11-29 DIAGNOSIS — N3289 Other specified disorders of bladder: Secondary | ICD-10-CM

## 2018-11-30 DIAGNOSIS — B961 Klebsiella pneumoniae [K. pneumoniae] as the cause of diseases classified elsewhere: Secondary | ICD-10-CM | POA: Diagnosis not present

## 2018-11-30 DIAGNOSIS — N390 Urinary tract infection, site not specified: Secondary | ICD-10-CM | POA: Diagnosis not present

## 2018-11-30 DIAGNOSIS — R300 Dysuria: Secondary | ICD-10-CM | POA: Diagnosis not present

## 2018-11-30 DIAGNOSIS — R8279 Other abnormal findings on microbiological examination of urine: Secondary | ICD-10-CM | POA: Diagnosis not present

## 2018-12-14 DIAGNOSIS — R300 Dysuria: Secondary | ICD-10-CM | POA: Diagnosis not present

## 2018-12-18 ENCOUNTER — Encounter: Payer: Self-pay | Admitting: Primary Care

## 2018-12-18 ENCOUNTER — Ambulatory Visit: Payer: BC Managed Care – PPO | Admitting: Primary Care

## 2018-12-18 ENCOUNTER — Other Ambulatory Visit: Payer: Self-pay

## 2018-12-18 VITALS — BP 136/86 | HR 84 | Temp 96.9°F | Ht 66.0 in | Wt 214.2 lb

## 2018-12-18 DIAGNOSIS — I10 Essential (primary) hypertension: Secondary | ICD-10-CM

## 2018-12-18 DIAGNOSIS — E559 Vitamin D deficiency, unspecified: Secondary | ICD-10-CM | POA: Diagnosis not present

## 2018-12-18 DIAGNOSIS — R7303 Prediabetes: Secondary | ICD-10-CM

## 2018-12-18 DIAGNOSIS — N390 Urinary tract infection, site not specified: Secondary | ICD-10-CM

## 2018-12-18 DIAGNOSIS — F329 Major depressive disorder, single episode, unspecified: Secondary | ICD-10-CM

## 2018-12-18 DIAGNOSIS — F419 Anxiety disorder, unspecified: Secondary | ICD-10-CM

## 2018-12-18 LAB — COMPREHENSIVE METABOLIC PANEL
ALT: 16 U/L (ref 0–35)
AST: 18 U/L (ref 0–37)
Albumin: 4 g/dL (ref 3.5–5.2)
Alkaline Phosphatase: 67 U/L (ref 39–117)
BUN: 15 mg/dL (ref 6–23)
CO2: 27 mEq/L (ref 19–32)
Calcium: 9.6 mg/dL (ref 8.4–10.5)
Chloride: 104 mEq/L (ref 96–112)
Creatinine, Ser: 0.83 mg/dL (ref 0.40–1.20)
GFR: 70.09 mL/min (ref >60.00)
Glucose, Bld: 100 mg/dL — ABNORMAL HIGH (ref 70–99)
Potassium: 4.2 mEq/L (ref 3.5–5.1)
Sodium: 138 mEq/L (ref 135–145)
Total Bilirubin: 0.5 mg/dL (ref 0.2–1.2)
Total Protein: 7.5 g/dL (ref 6.0–8.3)

## 2018-12-18 LAB — HEMOGLOBIN A1C: Hgb A1c MFr Bld: 5.7 % (ref 4.6–6.5)

## 2018-12-18 LAB — LIPID PANEL
Cholesterol: 192 mg/dL (ref 0–200)
HDL: 44.3 mg/dL (ref >39.00)
LDL Cholesterol: 135 mg/dL — ABNORMAL HIGH (ref 0–99)
NonHDL: 147.51
Total CHOL/HDL Ratio: 4
Triglycerides: 61 mg/dL (ref 0.0–149.0)
VLDL: 12.2 mg/dL (ref 0.0–40.0)

## 2018-12-18 LAB — VITAMIN D 25 HYDROXY (VIT D DEFICIENCY, FRACTURES): VITD: 25.47 ng/mL — ABNORMAL LOW (ref 30.00–100.00)

## 2018-12-18 NOTE — Assessment & Plan Note (Signed)
Repeat vitamin D pending. Encouraged weight bearing exercise.

## 2018-12-18 NOTE — Addendum Note (Signed)
Addended by: Ellamae Sia on: 12/18/2018 07:49 AM   Modules accepted: Orders

## 2018-12-18 NOTE — Assessment & Plan Note (Signed)
History of during Summer and Fall 2020, underwent cystoscopy which was unremarkable. Symptoms have resolved. Continue to monitor.

## 2018-12-18 NOTE — Assessment & Plan Note (Signed)
Doing well on Zoloft, continue current regimen.

## 2018-12-18 NOTE — Progress Notes (Signed)
Subjective:    Patient ID: Meagan Johnson, female    DOB: 1960/01/10, 58 y.o.   MRN: [id number]  HPI  Meagan Johnson is a 58 year old female who presents today for follow up.  1) Essential Hypertension: Currently managed on lisinopril 20 mg. She's had her BP checked at Urology twice which was 120's/70's. She denies shortness of breath, dizziness, chest pain.   BP Readings from Last 3 Encounters:  12/18/18 136/86  11/03/18 (!) 143/77  11/06/17 134/84     2) Anxiety and Depression: Currently managed on Zoloft 50 mg. Overall doing well on current regimen. Denies SI/HI.  3) Decreased Renal Function: Noted on several labs from mid and later 2020. She recently saw Urology who rechecked her labs which were back to normal. She denies urinary symptoms, endorses plenty of water intake, avoids NSAID's.   4) Recurrent UTI: Recently evaluated by Urology, has had several culture positive UTI's, also negative cultures. She underwent cystoscope recently, endorses normal report. Currently under treatment for another UTI. She denies urinary symptoms today and is feeling much better.   Review of Systems  Eyes: Negative for visual disturbance.  Respiratory: Negative for shortness of breath.   Cardiovascular: Negative for chest pain.  Neurological: Negative for dizziness and headaches.  Psychiatric/Behavioral: The patient is not nervous/anxious.        Past Medical History:  Diagnosis Date  . Allergy   . Hypertension   . Migraine   . Postmenopausal bleeding 2014  . Vaginal atrophy      Social History   Socioeconomic History  . Marital status: Widowed    Spouse name: Not on file  . Number of children: Not on file  . Years of education: Not on file  . Highest education level: Not on file  Occupational History  . Not on file  Tobacco Use  . Smoking status: Never Smoker  . Smokeless tobacco: Never Used  Substance and Sexual Activity  . Alcohol use: No    Alcohol/week: 0.0 standard  drinks  . Drug use: No  . Sexual activity: Not Currently    Birth control/protection: Post-menopausal  Other Topics Concern  . Not on file  Social History Narrative  . Not on file   Social Determinants of Health   Financial Resource Strain:   . Difficulty of Paying Living Expenses: Not on file  Food Insecurity:   . Worried About Charity fundraiser in the Last Year: Not on file  . Ran Out of Food in the Last Year: Not on file  Transportation Needs:   . Lack of Transportation (Medical): Not on file  . Lack of Transportation (Non-Medical): Not on file  Physical Activity:   . Days of Exercise per Week: Not on file  . Minutes of Exercise per Session: Not on file  Stress:   . Feeling of Stress : Not on file  Social Connections:   . Frequency of Communication with Friends and Family: Not on file  . Frequency of Social Gatherings with Friends and Family: Not on file  . Attends Religious Services: Not on file  . Active Member of Clubs or Organizations: Not on file  . Attends Archivist Meetings: Not on file  . Marital Status: Not on file  Intimate Partner Violence:   . Fear of Current or Ex-Partner: Not on file  . Emotionally Abused: Not on file  . Physically Abused: Not on file  . Sexually Abused: Not on file    Past  Surgical History:  Procedure Laterality Date  . BREAST BIOPSY Left 1987   neg  . DILATION AND CURETTAGE OF UTERUS  2014  . LASIK Bilateral     Family History  Problem Relation Age of Onset  . Alcohol abuse Mother   . Diabetes Mother   . Alcohol abuse Father   . Diabetes Father   . Diabetes Sister   . Drug abuse Brother   . Hypertension Brother   . Diabetes Brother   . Heart disease Maternal Grandmother   . Diabetes Maternal Grandmother   . Heart disease Maternal Grandfather   . Diabetes Maternal Grandfather   . Heart disease Paternal Grandmother   . Diabetes Paternal Grandmother   . Heart disease Paternal Grandfather   . Diabetes Paternal  Grandfather   . Cancer Neg Hx   . Breast cancer Neg Hx     No Known Allergies  Current Outpatient Medications on File Prior to Visit  Medication Sig Dispense Refill  . CALCIUM PO Take by mouth.    . cetirizine (ZYRTEC) 10 MG tablet TAKE 1 TABLET (10 MG TOTAL) BY MOUTH DAILY. 90 tablet 1  . Cholecalciferol (D3 ADULT PO) Take 1,000 Units by mouth daily.    Marland Kitchen ibuprofen (ADVIL,MOTRIN) 800 MG tablet TAKE 1 TABLET (800 MG TOTAL) BY MOUTH 2 (TWO) TIMES DAILY AS NEEDED FOR MODERATE PAIN. 60 tablet 0  . lisinopril (ZESTRIL) 20 MG tablet TAKE ONE TABLET BY MOUTH DAILY FOR BLOOD PRESSURE 90 tablet 0  . Multiple Vitamin (MULTIVITAMIN) tablet Take 1 tablet by mouth daily.    . sertraline (ZOLOFT) 50 MG tablet Take 1 tablet (50 mg total) by mouth daily. 90 tablet 0  . triamcinolone cream (KENALOG) 0.1 % Apply 1 application topically 2 (two) times daily. 30 g 0  . fluticasone (FLONASE) 50 MCG/ACT nasal spray Place 1 spray into both nostrils 2 (two) times daily. (Patient not taking: Reported on 12/18/2018) 16 g 11   No current facility-administered medications on file prior to visit.    BP 136/86   Pulse 84   Temp (!) 96.9 F (36.1 C) (Temporal)   Ht 5\' 6"  (1.676 m)   Wt 214 lb 4 oz (97.2 kg)   SpO2 98%   BMI 34.58 kg/m    Objective:   Physical Exam  Constitutional: She appears well-nourished.  Cardiovascular: Normal rate and regular rhythm.  Respiratory: Effort normal and breath sounds normal.  Musculoskeletal:     Cervical back: Neck supple.  Skin: Skin is warm and dry.  Psychiatric: She has a normal mood and affect.           Assessment & Plan:

## 2018-12-18 NOTE — Assessment & Plan Note (Signed)
Borderline in the office today, improved readings at other provider offices. Continue lisinopril. CMP pending.

## 2018-12-18 NOTE — Patient Instructions (Signed)
Stop by the lab prior to leaving today. I will notify you of your results once received.   Start exercising. You should be getting 150 minutes of moderate intensity exercise weekly.  It's important to improve your diet by reducing consumption of fast food, fried food, processed snack foods, sugary drinks. Increase consumption of fresh vegetables and fruits, whole grains, water.  Ensure you are drinking 64 ounces of water daily.  It was a pleasure to see you today!  

## 2018-12-18 NOTE — Assessment & Plan Note (Signed)
Encouraged healthy diet, regular exercise. A1C pending.

## 2018-12-26 ENCOUNTER — Other Ambulatory Visit: Payer: Self-pay | Admitting: Primary Care

## 2018-12-26 DIAGNOSIS — F329 Major depressive disorder, single episode, unspecified: Secondary | ICD-10-CM

## 2018-12-26 DIAGNOSIS — F419 Anxiety disorder, unspecified: Secondary | ICD-10-CM

## 2019-02-24 ENCOUNTER — Other Ambulatory Visit: Payer: Self-pay | Admitting: Primary Care

## 2019-02-24 DIAGNOSIS — I10 Essential (primary) hypertension: Secondary | ICD-10-CM

## 2019-07-02 ENCOUNTER — Other Ambulatory Visit: Payer: Self-pay

## 2019-07-02 ENCOUNTER — Ambulatory Visit
Admission: EM | Admit: 2019-07-02 | Discharge: 2019-07-02 | Disposition: A | Discharge status: Home / Self Care | Payer: PRIVATE HEALTH INSURANCE | Attending: Family Medicine | Admitting: Family Medicine

## 2019-07-02 DIAGNOSIS — J0110 Acute frontal sinusitis, unspecified: Secondary | ICD-10-CM | POA: Diagnosis not present

## 2019-07-02 MED ORDER — AMOXICILLIN 875 MG PO TABS: 875.0000 mg | ORAL_TABLET | Freq: Two times a day (BID) | ORAL | 0 refills | Status: AC

## 2019-07-02 NOTE — ED Triage Notes (Signed)
Patient reports productive cough, left ear pain, and cough since last Thursday. Has tried OTC medication with minimal relief.

## 2019-07-02 NOTE — ED Provider Notes (Signed)
[place]   [id number] 07/02/19 Arrival Time: 4193  XT:KWIO THROAT  SUBJECTIVE: History from: patient.  Meagan Johnson is a 59 y.o. female who presents with abrupt onset of nasal congestion, headache, left ear pain, fatigue for the last 7 days. Admits to sick exposure to Covid, strep, flu or mono, or precipitating event. Has tried sudafed, netti pot without relief. There are no aggravating symptoms. Denies previous symptoms in the past.     Denies fever, chills, fatigue, rhinorrhea, cough, SOB, wheezing, chest pain, nausea, rash, changes in bowel or bladder habits.    ROS: As per HPI.  All other pertinent ROS negative.     Past Medical History:  Diagnosis Date  . Allergy   . Hypertension   . Migraine   . Postmenopausal bleeding 2014  . Vaginal atrophy    Past Surgical History:  Procedure Laterality Date  . BREAST BIOPSY Left 1987   neg  . DILATION AND CURETTAGE OF UTERUS  2014  . LASIK Bilateral    No Known Allergies No current facility-administered medications on file prior to encounter.   Current Outpatient Medications on File Prior to Encounter  Medication Sig Dispense Refill  . CALCIUM PO Take by mouth.    . cetirizine (ZYRTEC) 10 MG tablet TAKE 1 TABLET (10 MG TOTAL) BY MOUTH DAILY. 90 tablet 1  . Cholecalciferol (D3 ADULT PO) Take 1,000 Units by mouth daily.    . fluticasone (FLONASE) 50 MCG/ACT nasal spray Place 1 spray into both nostrils 2 (two) times daily. (Patient not taking: Reported on 12/18/2018) 16 g 11  . ibuprofen (ADVIL,MOTRIN) 800 MG tablet TAKE 1 TABLET (800 MG TOTAL) BY MOUTH 2 (TWO) TIMES DAILY AS NEEDED FOR MODERATE PAIN. 60 tablet 0  . lisinopril (ZESTRIL) 20 MG tablet TAKE ONE TABLET BY MOUTH DAILY FOR BLOOD PRESSURE 90 tablet 1  . Multiple Vitamin (MULTIVITAMIN) tablet Take 1 tablet by mouth daily.    . sertraline (ZOLOFT) 50 MG tablet TAKE ONE TABLET BY MOUTH DAILY 90 tablet 1  . triamcinolone cream (KENALOG) 0.1 % Apply 1  application topically 2 (two) times daily. 30 g 0   Social History   Socioeconomic History  . Marital status: Widowed    Spouse name: Not on file  . Number of children: Not on file  . Years of education: Not on file  . Highest education level: Not on file  Occupational History  . Not on file  Tobacco Use  . Smoking status: Never Smoker  . Smokeless tobacco: Never Used  Substance and Sexual Activity  . Alcohol use: No    Alcohol/week: 0.0 standard drinks  . Drug use: No  . Sexual activity: Not Currently    Birth control/protection: Post-menopausal  Other Topics Concern  . Not on file  Social History Narrative  . Not on file   Social Determinants of Health   Financial Resource Strain:   . Difficulty of Paying Living Expenses:   Food Insecurity:   . Worried About Charity fundraiser in the Last Year:   . Arboriculturist in the Last Year:   Transportation Needs:   . Film/video editor (Medical):   Marland Kitchen Lack of Transportation (Non-Medical):   Physical Activity:   . Days of Exercise per Week:   . Minutes of Exercise per Session:   Stress:   . Feeling of Stress :   Social Connections:   . Frequency of Communication with Friends and Family:   . Frequency  of Social Gatherings with Friends and Family:   . Attends Religious Services:   . Active Member of Clubs or Organizations:   . Attends Banker Meetings:   Marland Kitchen Marital Status:   Intimate Partner Violence:   . Fear of Current or Ex-Partner:   . Emotionally Abused:   Marland Kitchen Physically Abused:   . Sexually Abused:    Family History  Problem Relation Age of Onset  . Alcohol abuse Mother   . Diabetes Mother   . Alcohol abuse Father   . Diabetes Father   . Diabetes Sister   . Drug abuse Brother   . Hypertension Brother   . Diabetes Brother   . Heart disease Maternal Grandmother   . Diabetes Maternal Grandmother   . Heart disease Maternal Grandfather   . Diabetes Maternal Grandfather   . Heart disease  Paternal Grandmother   . Diabetes Paternal Grandmother   . Heart disease Paternal Grandfather   . Diabetes Paternal Grandfather   . Cancer Neg Hx   . Breast cancer Neg Hx     OBJECTIVE:  Vitals:   07/02/19 1432  BP: 115/75  Pulse: 91  Resp: 15  Temp: 98.5 F (36.9 C)  SpO2: 95%     General appearance: alert; appears fatigued, but nontoxic, speaking in full sentences and managing own secretions HEENT: NCAT; Ears: EACs clear, TMs pearly gray with visible cone of light, without erythema; Eyes: PERRL, EOMI grossly; Nose: no obvious rhinorrhea; Throat: oropharynx clear, tonsils 1+ and mildly erythematous without white tonsillar exudates, uvula midline; sinus tenderness Neck: supple without LAD Lungs: CTA bilaterally without adventitious breath sounds; cough absent Heart: regular rate and rhythm.  Radial pulses 2+ symmetrical bilaterally Skin: warm and dry Psychological: alert and cooperative; normal mood and affect  LABS: No results found for this or any previous visit (from the past 24 hour(s)).   ASSESSMENT & PLAN:  1. Acute non-recurrent frontal sinusitis     Meds ordered this encounter  Medications  . amoxicillin (AMOXIL) 875 MG tablet    Sig: Take 1 tablet (875 mg total) by mouth 2 (two) times daily for 7 days.    Dispense:  14 tablet    Refill:  0    Order Specific Question:   Supervising Provider    Answer:   Merrilee Jansky [id number]    Acute Sinusitis Push fluids and get rest Prescribed amoxicillin 875mg  twice daily for 7 days.   Take as directed and to completion.  Drink warm or cool liquids, use throat lozenges, or popsicles to help alleviate symptoms Take OTC ibuprofen or tylenol as needed for pain Follow up with PCP if symptoms persist Return or go to ER if you have any new or worsening symptoms such as fever, chills, nausea, vomiting, worsening sore throat, cough, abdominal pain, chest pain, changes in bowel or bladder habits.   Reviewed expectations  re: course of current medical issues. Questions answered. Outlined signs and symptoms indicating need for more acute intervention. Patient verbalized understanding. After Visit Summary given.           , NP 07/02/19 1455

## 2019-07-02 NOTE — Discharge Instructions (Signed)
You have a sinus infection.  I have prescribed amoxicillin 875mg twice a day for 7 days.  Follow up with this office or with primary care if you are not feeling better over the next 2 days.  Follow up with the ER for trouble swallowing, trouble breathing, other concerning symptoms.  

## 2019-08-23 ENCOUNTER — Other Ambulatory Visit: Payer: Self-pay | Admitting: Primary Care

## 2019-08-23 DIAGNOSIS — I10 Essential (primary) hypertension: Secondary | ICD-10-CM

## 2019-09-22 ENCOUNTER — Other Ambulatory Visit: Payer: Self-pay | Admitting: Primary Care

## 2019-09-22 DIAGNOSIS — F32A Depression, unspecified: Secondary | ICD-10-CM

## 2020-02-21 ENCOUNTER — Other Ambulatory Visit: Payer: Self-pay | Admitting: Primary Care

## 2020-02-21 DIAGNOSIS — I10 Essential (primary) hypertension: Secondary | ICD-10-CM

## 2020-03-08 ENCOUNTER — Telehealth: Payer: Self-pay | Admitting: Primary Care

## 2020-03-08 NOTE — Telephone Encounter (Signed)
Patient was triaged by nurse. Nurse recommended that she be seen in our office in 24 hours. I have no openings as of now. Patient is scheduled for Friday to be seen. Patient has dark colored urine with foul smell. Please advise patient EM

## 2020-03-08 NOTE — Telephone Encounter (Signed)
Noted, will evaluate. 

## 2020-03-08 NOTE — Telephone Encounter (Signed)
[facility] - Client TELEPHONE ADVICE RECORD AccessNurse Patient Name: Meagan Johnson Gender: Female DOB: 1960-06-17 Age: 60 Y 4 M 10 D Return Phone Number: [phone] (Primary) Address: City/State/Zip: [place] [address] Client [facility] - Client Client Site [facility] - Day Physician Vernona Rieger - NP Contact Type Call Who Is Calling Patient / Member / Family / Caregiver Call Type Triage / Clinical Caller Name Irving Burton Relationship To Patient Other Return Phone Number [phone] (Primary) Chief Complaint Urine - unusual color Reason for Call Symptomatic / Request for Health Information Initial Comment Caller's calling in about a patient at the office. She has an appointment to be seen Friday but she is having dark, bad smelling urine and it is painful for her to pee. Translation No Nurse Assessment Nurse: Annye English, RN, Denise Date/Time (Eastern Time): 03/08/2020 10:53:20 AM Confirm and document reason for call. If symptomatic, describe symptoms. ---Pt w/ dark urine w/foul odor w/dysuria. Does the patient have any new or worsening symptoms? ---Yes Will a triage be completed? ---Yes Related visit to physician within the last 2 weeks? ---No Does the PT have any chronic conditions? (i.e. diabetes, asthma, this includes High risk factors for pregnancy, etc.) ---No Is this a behavioral health or substance abuse call? ---No Guidelines Guideline Title Affirmed Question Affirmed Notes Nurse Date/Time Lamount Cohen Time) Urination Pain - Female Age > 50 years Annye English, RN, Angelique Blonder 03/08/2020 10:54:46 AM Disp. Time Lamount Cohen Time) Disposition Final User 03/08/2020 10:56:51 AM See PCP within 24 Hours Yes Carmon, RN, Leighton Ruff Disagree/Comply Comply Caller Understands Yes PreDisposition Call Doctor PLEASE NOTE: All timestamps contained within this report are represented as [place] Standard Time. CONFIDENTIALTY  NOTICE: This fax transmission is intended only for the addressee. It contains information that is legally privileged, confidential or otherwise protected from use or disclosure. If you are not the intended recipient, you are strictly prohibited from reviewing, disclosing, copying using or disseminating any of this information or taking any action in reliance on or regarding this information. If you have received this fax in error, please notify us immediately by telephone so that we can arrange for its return to [place]. Phone: [phone], Toll-Free: [phone], Fax: [phone] Page: 2 of 2 Call Id: [id number] Care Advice Given Per Guideline SEE PCP WITHIN 24 HOURS: REASSURANCE AND EDUCATION: * This could be an urinary tract infection. DRINK EXTRA FLUIDS: * Drink extra fluids. * Drink 8 to 10 cups (1,800 to 2,400 ml) of liquids a day. CALL BACK IF: * Fever or back pain occurs * You become worse CARE ADVICE given per Urination Pain - Female (Adult) guideline. Referrals REFERRED TO PCP OFFICE

## 2020-03-08 NOTE — Telephone Encounter (Signed)
Spoke with patient who stated that she is having minimal pain with urination. Urine is dark and foul smelling. Patient denied fever, hematuria, SOB, or pain other than burning with urination. Instructed patient to drink plenty of water and if symptoms get worse to call back. UC and ED precautions given. Patient verbalized understanding. Has appointment on Friday.

## 2020-03-10 ENCOUNTER — Encounter: Payer: Self-pay | Admitting: Primary Care

## 2020-03-10 ENCOUNTER — Ambulatory Visit: Payer: BLUE CROSS/BLUE SHIELD | Admitting: Primary Care

## 2020-03-10 ENCOUNTER — Other Ambulatory Visit: Payer: Self-pay

## 2020-03-10 VITALS — BP 126/62 | HR 98 | Temp 97.8°F | Ht 66.0 in | Wt 190.0 lb

## 2020-03-10 DIAGNOSIS — Z1211 Encounter for screening for malignant neoplasm of colon: Secondary | ICD-10-CM | POA: Diagnosis not present

## 2020-03-10 DIAGNOSIS — R300 Dysuria: Secondary | ICD-10-CM | POA: Diagnosis not present

## 2020-03-10 LAB — POC URINALSYSI DIPSTICK (AUTOMATED)
Bilirubin, UA: NEGATIVE
Glucose, UA: NEGATIVE
Ketones, UA: NEGATIVE
Nitrite, UA: NEGATIVE
Protein, UA: NEGATIVE
Spec Grav, UA: 1.01 (ref 1.010–1.025)
Urobilinogen, UA: 0.2 E.U./dL
pH, UA: 6 (ref 5.0–8.0)

## 2020-03-10 MED ORDER — SULFAMETHOXAZOLE-TRIMETHOPRIM 800-160 MG PO TABS: 1.0000 | ORAL_TABLET | Freq: Two times a day (BID) | ORAL | 0 refills | Status: DC

## 2020-03-10 NOTE — Patient Instructions (Addendum)
Start Bactrim DS (sulfamethoxazole/trimethoprim) tablets for urinary tract infection. Take 1 tablet by mouth twice daily for 5 days.  .Ensure you are consuming 64 ounces of water daily.  You will be contacted regarding your referral to GI for the colonoscopy.  Please let us know if you have not been contacted within two weeks.   It was a pleasure to see you today!

## 2020-03-10 NOTE — Assessment & Plan Note (Signed)
Acute symptoms for 4 days, feels like prior UTI.  UA today with 3+ leuks, trace blood. Culture sent.  Given symptoms coupled with UA results and history, will treat.  Prescription for Bactrim DS tablets sent to pharmacy.  Encouraged plenty of water consumption.

## 2020-03-10 NOTE — Progress Notes (Signed)
Subjective:    Patient ID: Meagan Johnson, female    DOB: 12/27/1960, 60 y.o.   MRN: [id number]  HPI  This visit occurred during the SARS-CoV-2 public health emergency.  Safety protocols were in place, including screening questions prior to the visit, additional usage of staff PPE, and extensive cleaning of exam room while observing appropriate contact time as indicated for disinfecting solutions.   Meagan Johnson is a 60 year old female with a history of hypertension, recurrent UTI, vaginal atrophy, prediabetes who presents today with a chief complaint of dysuria.  She also reports dark yellow/brown color, foul smelling urine. Symptoms began about four days ago. She denies fevers, bright red bleeding, abdominal pain, pelvic pain.  She admits to not drinking much water recently, has increased water intake over the last 2 days which has improved symptoms.  Review of Systems  Gastrointestinal: Negative for abdominal pain and nausea.  Genitourinary: Positive for dysuria. Negative for frequency, hematuria, urgency and vaginal discharge.       Past Medical History:  Diagnosis Date  . Allergy   . Hypertension   . Migraine   . Postmenopausal bleeding 2014  . Vaginal atrophy      Social History   Socioeconomic History  . Marital status: Widowed    Spouse name: Not on file  . Number of children: Not on file  . Years of education: Not on file  . Highest education level: Not on file  Occupational History  . Not on file  Tobacco Use  . Smoking status: Never Smoker  . Smokeless tobacco: Never Used  Substance and Sexual Activity  . Alcohol use: No    Alcohol/week: 0.0 standard drinks  . Drug use: No  . Sexual activity: Not Currently    Birth control/protection: Post-menopausal  Other Topics Concern  . Not on file  Social History Narrative  . Not on file   Social Determinants of Health   Financial Resource Strain: Not on file  Food Insecurity: Not on file  Transportation  Needs: Not on file  Physical Activity: Not on file  Stress: Not on file  Social Connections: Not on file  Intimate Partner Violence: Not on file    Past Surgical History:  Procedure Laterality Date  . BREAST BIOPSY Left 1987   neg  . DILATION AND CURETTAGE OF UTERUS  2014  . LASIK Bilateral     Family History  Problem Relation Age of Onset  . Alcohol abuse Mother   . Diabetes Mother   . Alcohol abuse Father   . Diabetes Father   . Diabetes Sister   . Drug abuse Brother   . Hypertension Brother   . Diabetes Brother   . Heart disease Maternal Grandmother   . Diabetes Maternal Grandmother   . Heart disease Maternal Grandfather   . Diabetes Maternal Grandfather   . Heart disease Paternal Grandmother   . Diabetes Paternal Grandmother   . Heart disease Paternal Grandfather   . Diabetes Paternal Grandfather   . Cancer Neg Hx   . Breast cancer Neg Hx     No Known Allergies  Current Outpatient Medications on File Prior to Visit  Medication Sig Dispense Refill  . cetirizine (ZYRTEC) 10 MG tablet TAKE 1 TABLET (10 MG TOTAL) BY MOUTH DAILY. 90 tablet 1  . Cholecalciferol (D3 ADULT PO) Take 1,000 Units by mouth daily.    . fluticasone (FLONASE) 50 MCG/ACT nasal spray Place 1 spray into both nostrils 2 (two) times daily. 16  g 11  . lisinopril (ZESTRIL) 20 MG tablet TAKE ONE TABLET BY MOUTH DAILY FOR BLOOD PRESSURE 30 tablet 0  . Multiple Vitamin (MULTIVITAMIN) tablet Take 1 tablet by mouth daily.    . sertraline (ZOLOFT) 50 MG tablet TAKE ONE TABLET BY MOUTH DAILY 90 tablet 1   No current facility-administered medications on file prior to visit.    BP 126/62   Pulse 98   Temp 97.8 F (36.6 C) (Temporal)   Ht 5\' 6"  (1.676 m)   Wt 190 lb (86.2 kg)   SpO2 98%   BMI 30.67 kg/m    Objective:   Physical Exam Constitutional:      Appearance: She is well-nourished.  Cardiovascular:     Rate and Rhythm: Normal rate and regular rhythm.  Pulmonary:     Effort: Pulmonary  effort is normal.     Breath sounds: Normal breath sounds.  Abdominal:     Tenderness: There is no right CVA tenderness or left CVA tenderness.  Musculoskeletal:     Cervical back: Neck supple.  Skin:    General: Skin is warm and dry.  Psychiatric:        Mood and Affect: Mood and affect normal.            Assessment & Plan:

## 2020-03-11 LAB — URINE CULTURE
MICRO NUMBER:: 11608990
SPECIMEN QUALITY:: ADEQUATE

## 2020-03-16 DIAGNOSIS — E663 Overweight: Secondary | ICD-10-CM | POA: Diagnosis not present

## 2020-03-16 DIAGNOSIS — I10 Essential (primary) hypertension: Secondary | ICD-10-CM | POA: Diagnosis not present

## 2020-03-16 DIAGNOSIS — Z713 Dietary counseling and surveillance: Secondary | ICD-10-CM | POA: Diagnosis not present

## 2020-03-16 DIAGNOSIS — Z6829 Body mass index (BMI) 29.0-29.9, adult: Secondary | ICD-10-CM | POA: Diagnosis not present

## 2020-03-20 ENCOUNTER — Other Ambulatory Visit: Payer: Self-pay | Admitting: Primary Care

## 2020-03-20 DIAGNOSIS — F32A Depression, unspecified: Secondary | ICD-10-CM

## 2020-03-30 ENCOUNTER — Other Ambulatory Visit: Payer: Self-pay | Admitting: Primary Care

## 2020-03-30 DIAGNOSIS — I10 Essential (primary) hypertension: Secondary | ICD-10-CM

## 2020-03-30 NOTE — Telephone Encounter (Signed)
Called patient made f/u for CPE and lab

## 2020-03-30 NOTE — Telephone Encounter (Signed)
Looks like patient has not been in for general follow up since December 2020. She was just here for acute visit UTI, but needs follow up with updated labs. Please apologize but we need to see her very very soon. We can work with her schedule, in know she works in [place].

## 2020-04-06 ENCOUNTER — Telehealth: Payer: Self-pay

## 2020-04-06 ENCOUNTER — Telehealth: Payer: BLUE CROSS/BLUE SHIELD

## 2020-04-06 ENCOUNTER — Encounter: Payer: Self-pay | Admitting: *Deleted

## 2020-04-06 NOTE — Telephone Encounter (Signed)
Second attempt made to contact patient.  LVM for her to call office back to reschedule missed triage call.  Thanks,  [place], [place]

## 2020-04-06 NOTE — Telephone Encounter (Signed)
Pt was scheduled for 2:30 pm colonoscopy triage.  Unable to contact her.  LVM for her to call office back.  Thanks,  [place], [place]

## 2020-04-25 ENCOUNTER — Encounter: Payer: Self-pay | Admitting: Primary Care

## 2020-04-25 ENCOUNTER — Other Ambulatory Visit: Payer: Self-pay

## 2020-04-25 ENCOUNTER — Ambulatory Visit: Payer: BLUE CROSS/BLUE SHIELD | Admitting: Primary Care

## 2020-04-25 VITALS — BP 120/68 | HR 79 | Temp 98.6°F | Ht 66.0 in | Wt 192.0 lb

## 2020-04-25 DIAGNOSIS — J309 Allergic rhinitis, unspecified: Secondary | ICD-10-CM

## 2020-04-25 DIAGNOSIS — F419 Anxiety disorder, unspecified: Secondary | ICD-10-CM

## 2020-04-25 DIAGNOSIS — J019 Acute sinusitis, unspecified: Secondary | ICD-10-CM | POA: Insufficient documentation

## 2020-04-25 DIAGNOSIS — Z0000 Encounter for general adult medical examination without abnormal findings: Secondary | ICD-10-CM | POA: Diagnosis not present

## 2020-04-25 DIAGNOSIS — G43109 Migraine with aura, not intractable, without status migrainosus: Secondary | ICD-10-CM

## 2020-04-25 DIAGNOSIS — I10 Essential (primary) hypertension: Secondary | ICD-10-CM | POA: Diagnosis not present

## 2020-04-25 DIAGNOSIS — Z114 Encounter for screening for human immunodeficiency virus [HIV]: Secondary | ICD-10-CM

## 2020-04-25 DIAGNOSIS — F32A Depression, unspecified: Secondary | ICD-10-CM

## 2020-04-25 DIAGNOSIS — E559 Vitamin D deficiency, unspecified: Secondary | ICD-10-CM | POA: Diagnosis not present

## 2020-04-25 DIAGNOSIS — R7303 Prediabetes: Secondary | ICD-10-CM | POA: Diagnosis not present

## 2020-04-25 DIAGNOSIS — Z1231 Encounter for screening mammogram for malignant neoplasm of breast: Secondary | ICD-10-CM

## 2020-04-25 DIAGNOSIS — Z1211 Encounter for screening for malignant neoplasm of colon: Secondary | ICD-10-CM

## 2020-04-25 LAB — COMPREHENSIVE METABOLIC PANEL
ALT: 12 U/L (ref 0–35)
AST: 14 U/L (ref 0–37)
Albumin: 3.9 g/dL (ref 3.5–5.2)
Alkaline Phosphatase: 68 U/L (ref 39–117)
BUN: 17 mg/dL (ref 6–23)
CO2: 30 mEq/L (ref 19–32)
Calcium: 9.6 mg/dL (ref 8.4–10.5)
Chloride: 104 mEq/L (ref 96–112)
Creatinine, Ser: 0.83 mg/dL (ref 0.40–1.20)
GFR: 76.05 mL/min (ref >60.00)
Glucose, Bld: 94 mg/dL (ref 70–99)
Potassium: 4.4 mEq/L (ref 3.5–5.1)
Sodium: 139 mEq/L (ref 135–145)
Total Bilirubin: 0.6 mg/dL (ref 0.2–1.2)
Total Protein: 7.1 g/dL (ref 6.0–8.3)

## 2020-04-25 LAB — CBC
HCT: 42.7 % (ref 36.0–46.0)
Hemoglobin: 14.3 g/dL (ref 12.0–15.0)
MCHC: 33.4 g/dL (ref 30.0–36.0)
MCV: 91 fl (ref 78.0–100.0)
Platelets: 238 10*3/uL (ref 150.0–400.0)
RBC: 4.69 Mil/uL (ref 3.87–5.11)
RDW: 13.1 % (ref 11.5–15.5)
WBC: 9.3 10*3/uL (ref 4.0–10.5)

## 2020-04-25 LAB — LIPID PANEL
Cholesterol: 165 mg/dL (ref 0–200)
HDL: 46.5 mg/dL (ref >39.00)
LDL Cholesterol: 105 mg/dL — ABNORMAL HIGH (ref 0–99)
NonHDL: 118.25
Total CHOL/HDL Ratio: 4
Triglycerides: 67 mg/dL (ref 0.0–149.0)
VLDL: 13.4 mg/dL (ref 0.0–40.0)

## 2020-04-25 LAB — HEMOGLOBIN A1C: Hgb A1c MFr Bld: 5.6 % (ref 4.6–6.5)

## 2020-04-25 LAB — VITAMIN D 25 HYDROXY (VIT D DEFICIENCY, FRACTURES): VITD: 31.9 ng/mL (ref 30.00–100.00)

## 2020-04-25 MED ORDER — AMOXICILLIN-POT CLAVULANATE 875-125 MG PO TABS: 1.0000 | ORAL_TABLET | Freq: Two times a day (BID) | ORAL | 0 refills | Status: DC

## 2020-04-25 MED ORDER — LISINOPRIL 20 MG PO TABS: ORAL_TABLET | ORAL | 3 refills | Status: DC

## 2020-04-25 MED ORDER — FLUTICASONE PROPIONATE 50 MCG/ACT NA SUSP: 1.0000 | Freq: Two times a day (BID) | NASAL | 5 refills | Status: DC

## 2020-04-25 MED ORDER — SERTRALINE HCL 50 MG PO TABS
0.5000 | ORAL_TABLET | Freq: Every day | ORAL | 3 refills | Status: DC
Start: 2020-04-25 — End: 2020-08-01

## 2020-04-25 NOTE — Assessment & Plan Note (Signed)
Not taking vitamin D, repeat level pending. 

## 2020-04-25 NOTE — Patient Instructions (Addendum)
Stop by the lab prior to leaving today. I will notify you of your results once received.   Call [facility] to schedule your mammogram.   Start exercising. You should be getting 150 minutes of moderate intensity exercise weekly.  Continue to work on a healthy diet. Ensure you are consuming 64 ounces of water daily.  Start Augmentin antibiotics for the infection Take 1 tablet by mouth twice daily for 10 days.  It was a pleasure to see you today!   Preventive Care 62-4 Years Old, Female Preventive care refers to lifestyle choices and visits with your health care provider that can promote health and wellness. This includes:  A yearly physical exam. This is also called an annual wellness visit.  Regular dental and eye exams.  Immunizations.  Screening for certain conditions.  Healthy lifestyle choices, such as: ? Eating a healthy diet. ? Getting regular exercise. ? Not using drugs or products that contain nicotine and tobacco. ? Limiting alcohol use. What can I expect for my preventive care visit? Physical exam Your health care provider will check your:  Height and weight. These may be used to calculate your BMI (body mass index). BMI is a measurement that tells if you are at a healthy weight.  Heart rate and blood pressure.  Body temperature.  Skin for abnormal spots. Counseling Your health care provider may ask you questions about your:  Past medical problems.  Family's medical history.  Alcohol, tobacco, and drug use.  Emotional well-being.  Home life and relationship well-being.  Sexual activity.  Diet, exercise, and sleep habits.  Work and work Statistician.  Access to firearms.  Method of birth control.  Menstrual cycle.  Pregnancy history. What immunizations do I need? Vaccines are usually given at various ages, according to a schedule. Your health care provider will recommend vaccines for you based on your age, medical history, and  lifestyle or other factors, such as travel or where you work.   What tests do I need? Blood tests  Lipid and cholesterol levels. These may be checked every 5 years, or more often if you are over 65 years old.  Hepatitis C test.  Hepatitis B test. Screening  Lung cancer screening. You may have this screening every year starting at age 9 if you have a 30-pack-year history of smoking and currently smoke or have quit within the past 15 years.  Colorectal cancer screening. ? All adults should have this screening starting at age 30 and continuing until age 55. ? Your health care provider may recommend screening at age 39 if you are at increased risk. ? You will have tests every 1-10 years, depending on your results and the type of screening test.  Diabetes screening. ? This is done by checking your blood sugar (glucose) after you have not eaten for a while (fasting). ? You may have this done every 1-3 years.  Mammogram. ? This may be done every 1-2 years. ? Talk with your health care provider about when you should start having regular mammograms. This may depend on whether you have a family history of breast cancer.  BRCA-related cancer screening. This may be done if you have a family history of breast, ovarian, tubal, or peritoneal cancers.  Pelvic exam and Pap test. ? This may be done every 3 years starting at age 37. ? Starting at age 20, this may be done every 5 years if you have a Pap test in combination with an HPV test. Other tests  STD (sexually transmitted disease) testing, if you are at risk.  Bone density scan. This is done to screen for osteoporosis. You may have this scan if you are at high risk for osteoporosis. Talk with your health care provider about your test results, treatment options, and if necessary, the need for more tests. Follow these instructions at home: Eating and drinking  Eat a diet that includes fresh fruits and vegetables, whole grains, lean protein,  and low-fat dairy products.  Take vitamin and mineral supplements as recommended by your health care provider.  Do not drink alcohol if: ? Your health care provider tells you not to drink. ? You are pregnant, may be pregnant, or are planning to become pregnant.  If you drink alcohol: ? Limit how much you have to 0-1 drink a day. ? Be aware of how much alcohol is in your drink. In the [place] one drink equals one 12 oz bottle of beer (355 mL), one 5 oz glass of wine (148 mL), or one 1 oz glass of hard liquor (44 mL).   Lifestyle  Take daily care of your teeth and gums. Brush your teeth every morning and night with fluoride toothpaste. Floss one time each day.  Stay active. Exercise for at least 30 minutes 5 or more days each week.  Do not use any products that contain nicotine or tobacco, such as cigarettes, e-cigarettes, and chewing tobacco. If you need help quitting, ask your health care provider.  Do not use drugs.  If you are sexually active, practice safe sex. Use a condom or other form of protection to prevent STIs (sexually transmitted infections).  If you do not wish to become pregnant, use a form of birth control. If you plan to become pregnant, see your health care provider for a prepregnancy visit.  If told by your health care provider, take low-dose aspirin daily starting at age 33.  Find healthy ways to cope with stress, such as: ? Meditation, yoga, or listening to music. ? Journaling. ? Talking to a trusted person. ? Spending time with friends and family. Safety  Always wear your seat belt while driving or riding in a vehicle.  Do not drive: ? If you have been drinking alcohol. Do not ride with someone who has been drinking. ? When you are tired or distracted. ? While texting.  Wear a helmet and other protective equipment during sports activities.  If you have firearms in your house, make sure you follow all gun safety procedures. What's next?  Visit your  health care provider once a year for an annual wellness visit.  Ask your health care provider how often you should have your eyes and teeth checked.  Stay up to date on all vaccines. This information is not intended to replace advice given to you by your health care provider. Make sure you discuss any questions you have with your health care provider. Document Revised: 09/28/2019 Document Reviewed: 09/04/2017 Elsevier Patient Education  2021 [employer].

## 2020-04-25 NOTE — Assessment & Plan Note (Signed)
Well controlled in the office today, continue lisinopril 30 mg. CMP pending.

## 2020-04-25 NOTE — Assessment & Plan Note (Signed)
Declines Shingrix, Covid vaccines. Tetanus UTD. Pap smear UTD, follows with GYN. Mammogram due, order placed. Colon cancer screening due, patient prefers Cologuard which was ordered.   Discussed the importance of a healthy diet and regular exercise in order for weight loss, and to reduce the risk of any potential medical problems.  Exam today as noted. Labs pending.

## 2020-04-25 NOTE — Progress Notes (Signed)
Subjective:    Patient ID: Meagan Johnson, female    DOB: 11-Sep-1960, 60 y.o.   MRN: [id number]  HPI  Meagan Johnson is a very pleasant 60 y.o. female who presents today for complete physical.  She would also like to discuss sinus pressure. Acute sinus pressure to frontal and maxillary sinus regions bilaterally for the last 2 months, waxes and wanes, now without improvement. She's been doing sinus rinses and is getting thick, green mucous. She's been taking Mucinex-D and Sudafed at times.   Immunizations: -Tetanus: 2014 -Influenza: Did not completed -Covid-19: Did not receive -Shingles: Declines    Diet: She endorses a fair diet.  Exercise: She is exercising several days weekly  Eye exam: Completes annually  Dental exam: Completes semi-annually   Pap Smear: 2020, follows with GYN Mammogram: 2020, has appt with GYN for repeat mammogram Colonoscopy: Due. Would like to try Cologuard    Review of Systems  Constitutional: Negative for unexpected weight change.  HENT: Positive for rhinorrhea, sinus pressure and sinus pain.   Respiratory: Negative for cough and shortness of breath.   Cardiovascular: Negative for chest pain.  Gastrointestinal: Negative for constipation and diarrhea.  Genitourinary: Negative for difficulty urinating.  Musculoskeletal: Negative for arthralgias.  Skin: Negative for rash.  Allergic/Immunologic: Positive for environmental allergies.  Neurological: Negative for dizziness, numbness and headaches.  Psychiatric/Behavioral: The patient is nervous/anxious.          Past Medical History:  Diagnosis Date  . Allergy   . Hypertension   . Migraine   . Postmenopausal bleeding 2014  . Vaginal atrophy     Social History   Socioeconomic History  . Marital status: Widowed    Spouse name: Not on file  . Number of children: Not on file  . Years of education: Not on file  . Highest education level: Not on file  Occupational History  . Not on  file  Tobacco Use  . Smoking status: Never Smoker  . Smokeless tobacco: Never Used  Substance and Sexual Activity  . Alcohol use: No    Alcohol/week: 0.0 standard drinks  . Drug use: No  . Sexual activity: Not Currently    Birth control/protection: Post-menopausal  Other Topics Concern  . Not on file  Social History Narrative  . Not on file   Social Determinants of Health   Financial Resource Strain: Not on file  Food Insecurity: Not on file  Transportation Needs: Not on file  Physical Activity: Not on file  Stress: Not on file  Social Connections: Not on file  Intimate Partner Violence: Not on file    Past Surgical History:  Procedure Laterality Date  . BREAST BIOPSY Left 1987   neg  . DILATION AND CURETTAGE OF UTERUS  2014  . LASIK Bilateral     Family History  Problem Relation Age of Onset  . Alcohol abuse Mother   . Diabetes Mother   . Alcohol abuse Father   . Diabetes Father   . Diabetes Sister   . Drug abuse Brother   . Hypertension Brother   . Diabetes Brother   . Heart disease Maternal Grandmother   . Diabetes Maternal Grandmother   . Heart disease Maternal Grandfather   . Diabetes Maternal Grandfather   . Heart disease Paternal Grandmother   . Diabetes Paternal Grandmother   . Heart disease Paternal Grandfather   . Diabetes Paternal Grandfather   . Cancer Neg Hx   . Breast cancer Neg Hx  No Known Allergies  Current Outpatient Medications on File Prior to Visit  Medication Sig Dispense Refill  . cetirizine (ZYRTEC) 10 MG tablet TAKE 1 TABLET (10 MG TOTAL) BY MOUTH DAILY. 90 tablet 1  . lisinopril (ZESTRIL) 20 MG tablet TAKE ONE TABLET BY MOUTH DAILY FOR BLOOD PRESSURE 30 tablet 0  . Multiple Vitamin (MULTIVITAMIN) tablet Take 1 tablet by mouth daily.    . sertraline (ZOLOFT) 50 MG tablet TAKE ONE TABLET BY MOUTH DAILY (Patient taking differently: 1/2 tab daily) 90 tablet 1  . Cholecalciferol (D3 ADULT PO) Take 1,000 Units by mouth daily.  (Patient not taking: Reported on 04/25/2020)    . fluticasone (FLONASE) 50 MCG/ACT nasal spray Place 1 spray into both nostrils 2 (two) times daily. (Patient not taking: Reported on 04/25/2020) 16 g 11   No current facility-administered medications on file prior to visit.    BP 120/68   Pulse 79   Temp 98.6 F (37 C) (Temporal)   Ht 5\' 6"  (1.676 m)   Wt 192 lb (87.1 kg)   SpO2 95%   BMI 30.99 kg/m  Objective:   Physical Exam HENT:     Right Ear: Tympanic membrane and ear canal normal.     Left Ear: Tympanic membrane and ear canal normal.     Nose: Nose normal.  Eyes:     Conjunctiva/sclera: Conjunctivae normal.     Pupils: Pupils are equal, round, and reactive to light.  Neck:     Thyroid: No thyromegaly.  Cardiovascular:     Rate and Rhythm: Normal rate and regular rhythm.     Heart sounds: No murmur heard.   Pulmonary:     Effort: Pulmonary effort is normal.     Breath sounds: Normal breath sounds. No rales.  Abdominal:     General: Bowel sounds are normal.     Palpations: Abdomen is soft.     Tenderness: There is no abdominal tenderness.  Musculoskeletal:        General: Normal range of motion.     Cervical back: Neck supple.  Lymphadenopathy:     Cervical: No cervical adenopathy.  Skin:    General: Skin is warm and dry.     Findings: No rash.  Neurological:     Mental Status: She is alert and oriented to person, place, and time.     Cranial Nerves: No cranial nerve deficit.     Deep Tendon Reflexes: Reflexes are normal and symmetric.  Psychiatric:        Mood and Affect: Mood normal.           Assessment & Plan:      This visit occurred during the SARS-CoV-2 public health emergency.  Safety protocols were in place, including screening questions prior to the visit, additional usage of staff PPE, and extensive cleaning of exam room while observing appropriate contact time as indicated for disinfecting solutions.

## 2020-04-25 NOTE — Assessment & Plan Note (Signed)
Chronic, refills provided for Flonase. Continue Zyrtec 10 mg daily.  Treat acute sinusitis with Augmentin course.

## 2020-04-25 NOTE — Assessment & Plan Note (Signed)
Denies concerns, continue to monitor.  

## 2020-04-25 NOTE — Assessment & Plan Note (Signed)
Discussed the importance of a healthy diet and regular exercise in order for weight loss, and to reduce the risk of any potential medical problems.  Repeat A1C pending. 

## 2020-04-25 NOTE — Assessment & Plan Note (Signed)
Doing well on Zoloft 25 mg, continue same.  No refills needed at this time.

## 2020-04-25 NOTE — Assessment & Plan Note (Signed)
Two month history of symptoms, now progressing. Treat with Augmentin course BID x 10 days. Continue Flonase and Zyrtec.

## 2020-04-26 LAB — HIV ANTIBODY (ROUTINE TESTING W REFLEX): HIV 1&2 Ab, 4th Generation: NONREACTIVE

## 2020-05-10 LAB — COLOGUARD: Cologuard: NEGATIVE

## 2020-05-11 ENCOUNTER — Telehealth: Payer: Self-pay

## 2020-05-11 NOTE — Telephone Encounter (Signed)
Results received for colloguard. Placed in your box for review. States sample could not be processed. Please advise

## 2020-05-16 DIAGNOSIS — M9901 Segmental and somatic dysfunction of cervical region: Secondary | ICD-10-CM | POA: Diagnosis not present

## 2020-05-16 DIAGNOSIS — M50323 Other cervical disc degeneration at C6-C7 level: Secondary | ICD-10-CM | POA: Diagnosis not present

## 2020-05-16 DIAGNOSIS — M5384 Other specified dorsopathies, thoracic region: Secondary | ICD-10-CM | POA: Diagnosis not present

## 2020-05-16 DIAGNOSIS — M9902 Segmental and somatic dysfunction of thoracic region: Secondary | ICD-10-CM | POA: Diagnosis not present

## 2020-05-16 NOTE — Telephone Encounter (Signed)
Noted and reviewed. The form also mentions that the patient will be contacted regarding sample recollection.

## 2020-05-17 ENCOUNTER — Ambulatory Visit
Admission: RE | Admit: 2020-05-17 | Discharge: 2020-05-17 | Disposition: A | Discharge status: Home / Self Care | Payer: BLUE CROSS/BLUE SHIELD | Source: Ambulatory Visit | Attending: Primary Care | Admitting: Primary Care

## 2020-05-17 ENCOUNTER — Other Ambulatory Visit: Payer: Self-pay

## 2020-05-17 DIAGNOSIS — M9901 Segmental and somatic dysfunction of cervical region: Secondary | ICD-10-CM | POA: Diagnosis not present

## 2020-05-17 DIAGNOSIS — M5384 Other specified dorsopathies, thoracic region: Secondary | ICD-10-CM | POA: Diagnosis not present

## 2020-05-17 DIAGNOSIS — M50323 Other cervical disc degeneration at C6-C7 level: Secondary | ICD-10-CM | POA: Diagnosis not present

## 2020-05-17 DIAGNOSIS — M9902 Segmental and somatic dysfunction of thoracic region: Secondary | ICD-10-CM | POA: Diagnosis not present

## 2020-05-17 DIAGNOSIS — Z1231 Encounter for screening mammogram for malignant neoplasm of breast: Secondary | ICD-10-CM | POA: Insufficient documentation

## 2020-05-18 DIAGNOSIS — M50323 Other cervical disc degeneration at C6-C7 level: Secondary | ICD-10-CM | POA: Diagnosis not present

## 2020-05-18 DIAGNOSIS — M5384 Other specified dorsopathies, thoracic region: Secondary | ICD-10-CM | POA: Diagnosis not present

## 2020-05-18 DIAGNOSIS — M9902 Segmental and somatic dysfunction of thoracic region: Secondary | ICD-10-CM | POA: Diagnosis not present

## 2020-05-18 DIAGNOSIS — M9901 Segmental and somatic dysfunction of cervical region: Secondary | ICD-10-CM | POA: Diagnosis not present

## 2020-05-22 DIAGNOSIS — M5384 Other specified dorsopathies, thoracic region: Secondary | ICD-10-CM | POA: Diagnosis not present

## 2020-05-22 DIAGNOSIS — M9901 Segmental and somatic dysfunction of cervical region: Secondary | ICD-10-CM | POA: Diagnosis not present

## 2020-05-22 DIAGNOSIS — M9902 Segmental and somatic dysfunction of thoracic region: Secondary | ICD-10-CM | POA: Diagnosis not present

## 2020-05-22 DIAGNOSIS — M50323 Other cervical disc degeneration at C6-C7 level: Secondary | ICD-10-CM | POA: Diagnosis not present

## 2020-05-23 ENCOUNTER — Encounter: Payer: Self-pay | Admitting: Obstetrics and Gynecology

## 2020-05-23 ENCOUNTER — Other Ambulatory Visit: Payer: Self-pay

## 2020-05-23 ENCOUNTER — Ambulatory Visit (INDEPENDENT_AMBULATORY_CARE_PROVIDER_SITE_OTHER): Payer: BLUE CROSS/BLUE SHIELD | Admitting: Obstetrics and Gynecology

## 2020-05-23 VITALS — BP 122/76 | HR 94 | Ht 66.0 in | Wt 194.4 lb

## 2020-05-23 DIAGNOSIS — Z01419 Encounter for gynecological examination (general) (routine) without abnormal findings: Secondary | ICD-10-CM | POA: Diagnosis not present

## 2020-05-23 NOTE — Progress Notes (Signed)
HPI:      Meagan Johnson is a 60 y.o. [phone] who LMP was No LMP recorded. Patient is postmenopausal.  Subjective:   She presents today for her annual examination.  She reports that she is generally doing well.  She still has some issues with her "bladder following" but she continues to just deal with them and says that it is no worse than 2 years ago when she was here.  She reports that she does not leak with coughing laughing or sneezing. She does complain of hot flashes which seem to come and go. She is up-to-date on her lab work and mammography. Of significant note patient has a history of LEEP but her last 2 Pap smears were normal.    Hx: The following portions of the patient's history were reviewed and updated as appropriate:             She  has a past medical history of Allergy, Hypertension, Migraine, Postmenopausal bleeding (2014), and Vaginal atrophy. She does not have any pertinent problems on file. She  has a past surgical history that includes Dilation and curettage of uterus (2014); LASIK (Bilateral); Breast biopsy (Left, 1987); and Breast excisional biopsy. Her family history includes Alcohol abuse in her father and mother; Diabetes in her brother, father, maternal grandfather, maternal grandmother, mother, paternal grandfather, paternal grandmother, and sister; Drug abuse in her brother; Heart disease in her maternal grandfather, maternal grandmother, paternal grandfather, and paternal grandmother; Hypertension in her brother. She  reports that she has never smoked. She has never used smokeless tobacco. She reports that she does not drink alcohol and does not use drugs. She has a current medication list which includes the following prescription(s): cetirizine, cholecalciferol, fluticasone, lisinopril, multivitamin, sertraline, and amoxicillin-clavulanate. She has No Known Allergies.       Review of Systems:  Review of Systems  Constitutional: Denied constitutional  symptoms, night sweats, recent illness, fatigue, fever, insomnia and weight loss.  Eyes: Denied eye symptoms, eye pain, photophobia, vision change and visual disturbance.  Ears/Nose/Throat/Neck: Denied ear, nose, throat or neck symptoms, hearing loss, nasal discharge, sinus congestion and sore throat.  Cardiovascular: Denied cardiovascular symptoms, arrhythmia, chest pain/pressure, edema, exercise intolerance, orthopnea and palpitations.  Respiratory: Denied pulmonary symptoms, asthma, pleuritic pain, productive sputum, cough, dyspnea and wheezing.  Gastrointestinal: Denied, gastro-esophageal reflux, melena, nausea and vomiting.  Genitourinary: See HPI for additional information.  Musculoskeletal: Denied musculoskeletal symptoms, stiffness, swelling, muscle weakness and myalgia.  Dermatologic: Denied dermatology symptoms, rash and scar.  Neurologic: Denied neurology symptoms, dizziness, headache, neck pain and syncope.  Psychiatric: Denied psychiatric symptoms, anxiety and depression.  Endocrine: Denied endocrine symptoms including hot flashes and night sweats.   Meds:   Current Outpatient Medications on File Prior to Visit  Medication Sig Dispense Refill  . cetirizine (ZYRTEC) 10 MG tablet TAKE 1 TABLET (10 MG TOTAL) BY MOUTH DAILY. 90 tablet 1  . Cholecalciferol (D3 ADULT PO) Take 1,000 Units by mouth daily.    . fluticasone (FLONASE) 50 MCG/ACT nasal spray Place 1 spray into both nostrils 2 (two) times daily. 16 g 5  . lisinopril (ZESTRIL) 20 MG tablet TAKE ONE TABLET BY MOUTH DAILY FOR BLOOD PRESSURE 90 tablet 3  . Multiple Vitamin (MULTIVITAMIN) tablet Take 1 tablet by mouth daily.    . sertraline (ZOLOFT) 50 MG tablet Take 0.5 tablets (25 mg total) by mouth daily. For anxiety. 45 tablet 3  . amoxicillin-clavulanate (AUGMENTIN) 875-125 MG tablet Take 1 tablet by mouth 2 (two)  times daily. (Patient not taking: Reported on 05/23/2020) 20 tablet 0   No current facility-administered  medications on file prior to visit.          Objective:     Vitals:   05/23/20 0731  BP: 122/76  Pulse: 94    Filed Weights   05/23/20 0731  Weight: 194 lb 6.4 oz (88.2 kg)              Physical examination General NAD, Conversant  HEENT Atraumatic; Op clear with mmm.  Normo-cephalic. Pupils reactive. Anicteric sclerae  Thyroid/Neck Smooth without nodularity or enlargement. Normal ROM.  Neck Supple.  Skin No rashes, lesions or ulceration. Normal palpated skin turgor. No nodularity.  Breasts: No masses or discharge.  Symmetric.  No axillary adenopathy.  Lungs: Clear to auscultation.No rales or wheezes. Normal Respiratory effort, no retractions.  Heart: NSR.  No murmurs or rubs appreciated. No periferal edema  Abdomen: Soft.  Non-tender.  No masses.  No HSM. No hernia  Extremities: Moves all appropriately.  Normal ROM for age. No lymphadenopathy.  Neuro: Oriented to PPT.  Normal mood. Normal affect.     Pelvic:   Vulva: Normal appearance.  No lesions.  Vagina: No lesions or abnormalities noted.  Moderate vaginal atrophy  Support:  Second-degree pelvic prolapse second-degree cystocele second-degree rectocele  Urethra No masses tenderness or scarring.  Meatus Normal size without lesions or prolapse.  Cervix: Normal appearance.  No lesions.  Anus: Normal exam.  No lesions.  Perineum: Normal exam.  No lesions.        Bimanual   Uterus: Normal size.  Non-tender.  Mobile.  AV.  Adnexae: No masses.  Non-tender to palpation.  Cul-de-sac: Negative for abnormality.      Assessment:    [id number] Patient Active Problem List   Diagnosis Date Noted  . Acute non-recurrent sinusitis 04/25/2020  . Dysuria 03/10/2020  . Fatigue 08/04/2018  . Rash and nonspecific skin eruption 08/13/2016  . Chronic right shoulder pain 01/09/2016  . Cystocele, midline 10/18/2015  . Anxiety and depression 09/27/2015  . Borderline diabetes 03/29/2015  . Vitamin D deficiency 03/29/2015  .  Rhinitis, allergic 03/29/2015  . Preventative health care 03/29/2015  . Low back pain 03/29/2015  . Cystocele 03/08/2015  . Increased BMI 03/08/2015  . Vaginal atrophy 03/08/2015  . Recurrent UTI 03/08/2015  . Unstable bladder 03/08/2015  . Essential hypertension 08/25/2014  . Migraines 08/25/2014  . Plantar fasciitis 08/25/2014     1. Well woman exam with routine gynecological exam        Plan:            1.  Basic Screening Recommendations The basic screening recommendations for asymptomatic women were discussed with the patient during her visit.  The age-appropriate recommendations were discussed with her and the rational for the tests reviewed.  When I am informed by the patient that another primary care physician has previously obtained the age-appropriate tests and they are up-to-date, only outstanding tests are ordered and referrals given as necessary.  Abnormal results of tests will be discussed with her when all of her results are completed.  Routine preventative health maintenance measures emphasized: Exercise/Diet/Weight control, Tobacco Warnings, Alcohol/Substance use risks and Stress Management Recommend Pap next year. 2.  Patient to inform us if her problems with her cystocele become worse and she would like to consider other options. Orders No orders of the defined types were placed in this encounter.   No orders of the defined types  were placed in this encounter.         F/U  Return in about 1 year (around 05/23/2021) for Annual Physical.  Elonda Husky, M.D. 05/23/2020 8:06 AM

## 2020-05-25 DIAGNOSIS — M50323 Other cervical disc degeneration at C6-C7 level: Secondary | ICD-10-CM | POA: Diagnosis not present

## 2020-05-25 DIAGNOSIS — M5384 Other specified dorsopathies, thoracic region: Secondary | ICD-10-CM | POA: Diagnosis not present

## 2020-05-25 DIAGNOSIS — M9902 Segmental and somatic dysfunction of thoracic region: Secondary | ICD-10-CM | POA: Diagnosis not present

## 2020-05-25 DIAGNOSIS — M9901 Segmental and somatic dysfunction of cervical region: Secondary | ICD-10-CM | POA: Diagnosis not present

## 2020-05-29 DIAGNOSIS — Z1211 Encounter for screening for malignant neoplasm of colon: Secondary | ICD-10-CM | POA: Diagnosis not present

## 2020-05-29 DIAGNOSIS — M9902 Segmental and somatic dysfunction of thoracic region: Secondary | ICD-10-CM | POA: Diagnosis not present

## 2020-05-29 DIAGNOSIS — M50323 Other cervical disc degeneration at C6-C7 level: Secondary | ICD-10-CM | POA: Diagnosis not present

## 2020-05-29 DIAGNOSIS — M9901 Segmental and somatic dysfunction of cervical region: Secondary | ICD-10-CM | POA: Diagnosis not present

## 2020-05-29 DIAGNOSIS — M5384 Other specified dorsopathies, thoracic region: Secondary | ICD-10-CM | POA: Diagnosis not present

## 2020-05-29 LAB — COLOGUARD: Cologuard: NEGATIVE

## 2020-05-31 DIAGNOSIS — M5384 Other specified dorsopathies, thoracic region: Secondary | ICD-10-CM | POA: Diagnosis not present

## 2020-05-31 DIAGNOSIS — M9901 Segmental and somatic dysfunction of cervical region: Secondary | ICD-10-CM | POA: Diagnosis not present

## 2020-05-31 DIAGNOSIS — M9902 Segmental and somatic dysfunction of thoracic region: Secondary | ICD-10-CM | POA: Diagnosis not present

## 2020-05-31 DIAGNOSIS — M50323 Other cervical disc degeneration at C6-C7 level: Secondary | ICD-10-CM | POA: Diagnosis not present

## 2020-06-04 LAB — COLOGUARD: COLOGUARD: NEGATIVE

## 2020-06-08 ENCOUNTER — Encounter: Payer: Self-pay | Admitting: Primary Care

## 2020-06-13 DIAGNOSIS — M9902 Segmental and somatic dysfunction of thoracic region: Secondary | ICD-10-CM | POA: Diagnosis not present

## 2020-06-13 DIAGNOSIS — M9901 Segmental and somatic dysfunction of cervical region: Secondary | ICD-10-CM | POA: Diagnosis not present

## 2020-06-13 DIAGNOSIS — M50323 Other cervical disc degeneration at C6-C7 level: Secondary | ICD-10-CM | POA: Diagnosis not present

## 2020-06-13 DIAGNOSIS — M5384 Other specified dorsopathies, thoracic region: Secondary | ICD-10-CM | POA: Diagnosis not present

## 2020-06-15 DIAGNOSIS — M9902 Segmental and somatic dysfunction of thoracic region: Secondary | ICD-10-CM | POA: Diagnosis not present

## 2020-06-15 DIAGNOSIS — M9901 Segmental and somatic dysfunction of cervical region: Secondary | ICD-10-CM | POA: Diagnosis not present

## 2020-06-15 DIAGNOSIS — M50323 Other cervical disc degeneration at C6-C7 level: Secondary | ICD-10-CM | POA: Diagnosis not present

## 2020-06-15 DIAGNOSIS — M5384 Other specified dorsopathies, thoracic region: Secondary | ICD-10-CM | POA: Diagnosis not present

## 2020-06-19 DIAGNOSIS — M50323 Other cervical disc degeneration at C6-C7 level: Secondary | ICD-10-CM | POA: Diagnosis not present

## 2020-06-19 DIAGNOSIS — M5384 Other specified dorsopathies, thoracic region: Secondary | ICD-10-CM | POA: Diagnosis not present

## 2020-06-19 DIAGNOSIS — M9901 Segmental and somatic dysfunction of cervical region: Secondary | ICD-10-CM | POA: Diagnosis not present

## 2020-06-19 DIAGNOSIS — M9902 Segmental and somatic dysfunction of thoracic region: Secondary | ICD-10-CM | POA: Diagnosis not present

## 2020-06-21 DIAGNOSIS — M5384 Other specified dorsopathies, thoracic region: Secondary | ICD-10-CM | POA: Diagnosis not present

## 2020-06-21 DIAGNOSIS — M50323 Other cervical disc degeneration at C6-C7 level: Secondary | ICD-10-CM | POA: Diagnosis not present

## 2020-06-21 DIAGNOSIS — M9901 Segmental and somatic dysfunction of cervical region: Secondary | ICD-10-CM | POA: Diagnosis not present

## 2020-06-21 DIAGNOSIS — M9902 Segmental and somatic dysfunction of thoracic region: Secondary | ICD-10-CM | POA: Diagnosis not present

## 2020-06-28 DIAGNOSIS — M9901 Segmental and somatic dysfunction of cervical region: Secondary | ICD-10-CM | POA: Diagnosis not present

## 2020-06-28 DIAGNOSIS — M50323 Other cervical disc degeneration at C6-C7 level: Secondary | ICD-10-CM | POA: Diagnosis not present

## 2020-06-28 DIAGNOSIS — M9902 Segmental and somatic dysfunction of thoracic region: Secondary | ICD-10-CM | POA: Diagnosis not present

## 2020-06-28 DIAGNOSIS — M5384 Other specified dorsopathies, thoracic region: Secondary | ICD-10-CM | POA: Diagnosis not present

## 2020-07-05 DIAGNOSIS — M5384 Other specified dorsopathies, thoracic region: Secondary | ICD-10-CM | POA: Diagnosis not present

## 2020-07-05 DIAGNOSIS — M50323 Other cervical disc degeneration at C6-C7 level: Secondary | ICD-10-CM | POA: Diagnosis not present

## 2020-07-05 DIAGNOSIS — M9901 Segmental and somatic dysfunction of cervical region: Secondary | ICD-10-CM | POA: Diagnosis not present

## 2020-07-05 DIAGNOSIS — M9902 Segmental and somatic dysfunction of thoracic region: Secondary | ICD-10-CM | POA: Diagnosis not present

## 2020-07-18 DIAGNOSIS — M9902 Segmental and somatic dysfunction of thoracic region: Secondary | ICD-10-CM | POA: Diagnosis not present

## 2020-07-18 DIAGNOSIS — M9901 Segmental and somatic dysfunction of cervical region: Secondary | ICD-10-CM | POA: Diagnosis not present

## 2020-07-18 DIAGNOSIS — M50323 Other cervical disc degeneration at C6-C7 level: Secondary | ICD-10-CM | POA: Diagnosis not present

## 2020-07-18 DIAGNOSIS — M5384 Other specified dorsopathies, thoracic region: Secondary | ICD-10-CM | POA: Diagnosis not present

## 2020-07-25 DIAGNOSIS — M9901 Segmental and somatic dysfunction of cervical region: Secondary | ICD-10-CM | POA: Diagnosis not present

## 2020-07-25 DIAGNOSIS — M50323 Other cervical disc degeneration at C6-C7 level: Secondary | ICD-10-CM | POA: Diagnosis not present

## 2020-07-25 DIAGNOSIS — M5384 Other specified dorsopathies, thoracic region: Secondary | ICD-10-CM | POA: Diagnosis not present

## 2020-07-25 DIAGNOSIS — M9902 Segmental and somatic dysfunction of thoracic region: Secondary | ICD-10-CM | POA: Diagnosis not present

## 2020-07-29 DIAGNOSIS — F419 Anxiety disorder, unspecified: Secondary | ICD-10-CM

## 2020-07-31 NOTE — Telephone Encounter (Signed)
FYI

## 2020-08-01 MED ORDER — SERTRALINE HCL 50 MG PO TABS: 50.0000 mg | ORAL_TABLET | Freq: Every day | ORAL | 2 refills | Status: DC

## 2020-08-23 DIAGNOSIS — L821 Other seborrheic keratosis: Secondary | ICD-10-CM | POA: Diagnosis not present

## 2020-08-23 DIAGNOSIS — L814 Other melanin hyperpigmentation: Secondary | ICD-10-CM | POA: Diagnosis not present

## 2020-08-23 DIAGNOSIS — D485 Neoplasm of uncertain behavior of skin: Secondary | ICD-10-CM | POA: Diagnosis not present

## 2020-08-23 DIAGNOSIS — L918 Other hypertrophic disorders of the skin: Secondary | ICD-10-CM | POA: Diagnosis not present

## 2020-10-10 DIAGNOSIS — N3000 Acute cystitis without hematuria: Secondary | ICD-10-CM | POA: Diagnosis not present

## 2020-10-16 DIAGNOSIS — D485 Neoplasm of uncertain behavior of skin: Secondary | ICD-10-CM | POA: Diagnosis not present

## 2020-10-16 DIAGNOSIS — L814 Other melanin hyperpigmentation: Secondary | ICD-10-CM | POA: Diagnosis not present

## 2020-10-16 DIAGNOSIS — L918 Other hypertrophic disorders of the skin: Secondary | ICD-10-CM | POA: Diagnosis not present

## 2020-10-17 DIAGNOSIS — M47812 Spondylosis without myelopathy or radiculopathy, cervical region: Secondary | ICD-10-CM | POA: Diagnosis not present

## 2020-10-22 DIAGNOSIS — Z1389 Encounter for screening for other disorder: Secondary | ICD-10-CM | POA: Diagnosis not present

## 2020-10-23 DIAGNOSIS — L814 Other melanin hyperpigmentation: Secondary | ICD-10-CM | POA: Diagnosis not present

## 2020-10-23 DIAGNOSIS — L011 Impetiginization of other dermatoses: Secondary | ICD-10-CM | POA: Diagnosis not present

## 2020-10-23 DIAGNOSIS — D2362 Other benign neoplasm of skin of left upper limb, including shoulder: Secondary | ICD-10-CM | POA: Diagnosis not present

## 2020-10-23 DIAGNOSIS — L231 Allergic contact dermatitis due to adhesives: Secondary | ICD-10-CM | POA: Diagnosis not present

## 2020-12-05 DIAGNOSIS — L905 Scar conditions and fibrosis of skin: Secondary | ICD-10-CM | POA: Diagnosis not present

## 2020-12-05 DIAGNOSIS — L570 Actinic keratosis: Secondary | ICD-10-CM | POA: Diagnosis not present

## 2021-01-25 ENCOUNTER — Encounter: Payer: Self-pay | Admitting: Primary Care

## 2021-01-25 ENCOUNTER — Other Ambulatory Visit: Payer: Self-pay

## 2021-01-25 ENCOUNTER — Ambulatory Visit: Payer: BLUE CROSS/BLUE SHIELD | Admitting: Primary Care

## 2021-01-25 DIAGNOSIS — R232 Flushing: Secondary | ICD-10-CM | POA: Diagnosis not present

## 2021-01-25 DIAGNOSIS — R194 Change in bowel habit: Secondary | ICD-10-CM

## 2021-01-25 HISTORY — DX: Change in bowel habit: R19.4

## 2021-01-25 LAB — TSH: TSH: 2.26 u[IU]/mL (ref 0.35–5.50)

## 2021-01-25 LAB — COMPREHENSIVE METABOLIC PANEL
ALT: 13 U/L (ref 0–35)
AST: 17 U/L (ref 0–37)
Albumin: 4.2 g/dL (ref 3.5–5.2)
Alkaline Phosphatase: 57 U/L (ref 39–117)
BUN: 15 mg/dL (ref 6–23)
CO2: 31 mEq/L (ref 19–32)
Calcium: 9.8 mg/dL (ref 8.4–10.5)
Chloride: 104 mEq/L (ref 96–112)
Creatinine, Ser: 0.93 mg/dL (ref 0.40–1.20)
GFR: 65.99 mL/min (ref >60.00)
Glucose, Bld: 90 mg/dL (ref 70–99)
Potassium: 4.5 mEq/L (ref 3.5–5.1)
Sodium: 141 mEq/L (ref 135–145)
Total Bilirubin: 0.4 mg/dL (ref 0.2–1.2)
Total Protein: 7.3 g/dL (ref 6.0–8.3)

## 2021-01-25 LAB — CBC
HCT: 43.6 % (ref 36.0–46.0)
Hemoglobin: 14.5 g/dL (ref 12.0–15.0)
MCHC: 33.3 g/dL (ref 30.0–36.0)
MCV: 90.7 fl (ref 78.0–100.0)
Platelets: 232 10*3/uL (ref 150.0–400.0)
RBC: 4.81 Mil/uL (ref 3.87–5.11)
RDW: 12.8 % (ref 11.5–15.5)
WBC: 9.2 10*3/uL (ref 4.0–10.5)

## 2021-01-25 LAB — LUTEINIZING HORMONE: LH: 23.92 m[IU]/mL

## 2021-01-25 LAB — HEMOGLOBIN A1C: Hgb A1c MFr Bld: 6 % (ref 4.6–6.5)

## 2021-01-25 LAB — FOLLICLE STIMULATING HORMONE: FSH: 47.6 m[IU]/mL

## 2021-01-25 NOTE — Assessment & Plan Note (Signed)
Unclear cause at this point. Doesn't appear infectious. No alarm signs during HPI. No signs of an ulcer/GERD.  Will have her stop the raw honey and ginger shots, these could be contributing.  Recommended ultrasound to evaluate gall bladder, she kindly declines but will notify if symptoms do not improve after stopping honey and ginger.  Cologuard negative 1 year ago. Consider colonoscopy.  Checking labs today.

## 2021-01-25 NOTE — Patient Instructions (Signed)
Stop by the lab prior to leaving today. I will notify you of your results once received.   Stop the raw honey and ginger shots.  Update me if no improvement in symptoms.  It was a pleasure to see you today!

## 2021-01-25 NOTE — Assessment & Plan Note (Signed)
Unclear if this is hormonal or related to her GI symptoms.  Checking labs today including TSH, estrogen, LH, FSH, CBC, CMP, A1C.   No alarm signs.

## 2021-01-25 NOTE — Progress Notes (Signed)
Subjective:    Patient ID: Meagan Johnson, female    DOB: Jun 07, 1960, 61 y.o.   MRN: [id number]  HPI  Meagan Johnson is a very pleasant 61 y.o. female with a history of hypertension, hysterectomy, migraines, recurrent UTI, cystocele, low back pain, prediabetes, fatigue who presents today to discuss several symptoms.  1) Frequent Bowel Movements: Chronic for about 3 months. Will have a bowel movement within 30 minutes of eating a sizeable meal (breakfast, lunch, dinner), sometimes solid, sometimes diarrhea. She's also noticed increased flatulence, has the urgency to want to eat and will experience a "hunger pain" to her mid abdomen that is relieved with eating.   Historically she has solid bowel movements twice daily, never urgency or had to have a movement just after a meal. Her son does have frequent movements within 30 minutes after eating.   She denies rectal bleeding, changes in her diet, changes in medication, nausea/vomiting, recent antibiotic use, unexplained weight loss, esophageal reflux. Her last colonoscopy was in 2011, Cologuard negative in May 2022. She has been using a neighbors local honey and is drinking ginger shots, started just before her symptoms began.   2) Night Sweats: Began 2-3 months ago, around the same time as her GI symptoms. Occurs nightly, sometimes twice nightly, wakes up "totally drenched". Hot flashes are occurring during the day, but hot flashes are not as intense.   She denies unexplained weight loss, vaginal bleeding. She went through menopause years ago.    Review of Systems  Constitutional:  Negative for unexpected weight change.  Gastrointestinal:  Positive for diarrhea. Negative for abdominal pain, constipation, nausea and vomiting.       Increased bowel movements  Genitourinary:  Negative for vaginal bleeding.       Hot flashes, night sweats        Past Medical History:  Diagnosis Date   Allergy    Hypertension    Migraine     Postmenopausal bleeding 2014   Vaginal atrophy     Social History   Socioeconomic History   Marital status: Widowed    Spouse name: Not on file   Number of children: Not on file   Years of education: Not on file   Highest education level: Not on file  Occupational History   Not on file  Tobacco Use   Smoking status: Never   Smokeless tobacco: Never  Substance and Sexual Activity   Alcohol use: No    Alcohol/week: 0.0 standard drinks   Drug use: No   Sexual activity: Not Currently    Birth control/protection: Post-menopausal  Other Topics Concern   Not on file  Social History Narrative   Not on file   Social Determinants of Health   Financial Resource Strain: Not on file  Food Insecurity: Not on file  Transportation Needs: Not on file  Physical Activity: Not on file  Stress: Not on file  Social Connections: Not on file  Intimate Partner Violence: Not on file    Past Surgical History:  Procedure Laterality Date   BREAST BIOPSY Left 1987   neg   BREAST EXCISIONAL BIOPSY     DILATION AND CURETTAGE OF UTERUS  2014   LASIK Bilateral     Family History  Problem Relation Age of Onset   Alcohol abuse Mother    Diabetes Mother    Alcohol abuse Father    Diabetes Father    Drug abuse Brother    Hypertension Brother    Diabetes Brother  COPD Maternal Grandmother    Cancer Neg Hx    Breast cancer Neg Hx     No Known Allergies  Current Outpatient Medications on File Prior to Visit  Medication Sig Dispense Refill   cetirizine (ZYRTEC) 10 MG tablet TAKE 1 TABLET (10 MG TOTAL) BY MOUTH DAILY. 90 tablet 1   Cholecalciferol (D3 ADULT PO) Take 1,000 Units by mouth daily.     fluticasone (FLONASE) 50 MCG/ACT nasal spray Place 1 spray into both nostrils 2 (two) times daily. 16 g 5   lisinopril (ZESTRIL) 20 MG tablet TAKE ONE TABLET BY MOUTH DAILY FOR BLOOD PRESSURE 90 tablet 3   Multiple Vitamin (MULTIVITAMIN) tablet Take 1 tablet by mouth daily.     sertraline  (ZOLOFT) 50 MG tablet Take 1 tablet (50 mg total) by mouth daily. For anxiety. 90 tablet 2   No current facility-administered medications on file prior to visit.    BP (!) 144/70    Pulse 81    Temp 98.4 F (36.9 C) (Temporal)    Ht 5\' 6"  (1.676 m)    Wt 217 lb 6 oz (98.6 kg)    SpO2 94%    BMI 35.09 kg/m  Objective:   Physical Exam Constitutional:      General: She is not in acute distress.    Appearance: She is not ill-appearing.  Cardiovascular:     Rate and Rhythm: Normal rate and regular rhythm.  Pulmonary:     Effort: Pulmonary effort is normal.     Breath sounds: Normal breath sounds.  Abdominal:     General: Bowel sounds are normal.     Palpations: Abdomen is soft.     Tenderness: There is no abdominal tenderness.  Musculoskeletal:     Cervical back: Neck supple.  Skin:    General: Skin is warm and dry.  Psychiatric:        Mood and Affect: Mood normal.          Assessment & Plan:      This visit occurred during the SARS-CoV-2 public health emergency.  Safety protocols were in place, including screening questions prior to the visit, additional usage of staff PPE, and extensive cleaning of exam room while observing appropriate contact time as indicated for disinfecting solutions.

## 2021-01-26 DIAGNOSIS — R194 Change in bowel habit: Secondary | ICD-10-CM

## 2021-01-26 DIAGNOSIS — R140 Abdominal distension (gaseous): Secondary | ICD-10-CM

## 2021-01-31 LAB — ESTROGENS, TOTAL: Estrogen: 181.1 pg/mL

## 2021-02-01 ENCOUNTER — Ambulatory Visit: Payer: BLUE CROSS/BLUE SHIELD | Admitting: Obstetrics and Gynecology

## 2021-02-01 ENCOUNTER — Other Ambulatory Visit: Payer: Self-pay

## 2021-02-01 ENCOUNTER — Ambulatory Visit
Admission: RE | Admit: 2021-02-01 | Discharge: 2021-02-01 | Disposition: A | Discharge status: Home / Self Care | Payer: BLUE CROSS/BLUE SHIELD | Source: Ambulatory Visit | Attending: Primary Care | Admitting: Primary Care

## 2021-02-01 ENCOUNTER — Encounter: Payer: Self-pay | Admitting: Obstetrics and Gynecology

## 2021-02-01 VITALS — BP 141/65 | HR 100 | Ht 66.0 in | Wt 218.0 lb

## 2021-02-01 DIAGNOSIS — R140 Abdominal distension (gaseous): Secondary | ICD-10-CM

## 2021-02-01 DIAGNOSIS — R194 Change in bowel habit: Secondary | ICD-10-CM

## 2021-02-01 DIAGNOSIS — R197 Diarrhea, unspecified: Secondary | ICD-10-CM | POA: Diagnosis not present

## 2021-02-01 DIAGNOSIS — N951 Menopausal and female climacteric states: Secondary | ICD-10-CM | POA: Diagnosis not present

## 2021-02-01 DIAGNOSIS — K760 Fatty (change of) liver, not elsewhere classified: Secondary | ICD-10-CM | POA: Diagnosis not present

## 2021-02-01 MED ORDER — ESTRADIOL-NORETHINDRONE ACET 1-0.5 MG PO TABS: 1.0000 | ORAL_TABLET | Freq: Every day | ORAL | 1 refills | Status: DC

## 2021-02-01 NOTE — Progress Notes (Signed)
HPI:      [name] is a 61 y.o. [phone] who LMP was No LMP recorded. Patient is postmenopausal.  Subjective:   She presents today with complaint of significantly worsening menopausal symptoms that include daily hot flashes and frequent night sweats.  She states that this has become significantly worse in the last several months.  She states that "I cannot live like this and need to do something to improve my hot flashes and night sweats". Patient has been noted to be in menopause since the age of 5 but during much of that time has been on HRT.  She is not sure when she discontinued HRT as it has been a few years. In addition, patient has been followed for pelvic relaxation.    Hx: The following portions of the patient's history were reviewed and updated as appropriate:             She  has a past medical history of Allergy, Hypertension, Migraine, Postmenopausal bleeding (2014), and Vaginal atrophy. She does not have any pertinent problems on file. She  has a past surgical history that includes Dilation and curettage of uterus (2014); LASIK (Bilateral); Breast biopsy (Left, 1987); Breast excisional biopsy; and Tubal ligation. Her family history includes Alcohol abuse in her father and mother; COPD in her maternal grandmother; Diabetes in her brother, father, and mother; Drug abuse in her brother; Hypertension in her brother. She  reports that she has never smoked. She has never used smokeless tobacco. She reports that she does not drink alcohol and does not use drugs. She has a current medication list which includes the following prescription(s): cetirizine, cholecalciferol, estradiol-norethindrone, fluticasone, lisinopril, multivitamin, and sertraline. She has No Known Allergies.       Review of Systems:  Review of Systems  Constitutional: Denied constitutional symptoms, night sweats, recent illness, fatigue, fever, insomnia and weight loss.  Eyes: Denied eye symptoms, eye pain,  photophobia, vision change and visual disturbance.  Ears/Nose/Throat/Neck: Denied ear, nose, throat or neck symptoms, hearing loss, nasal discharge, sinus congestion and sore throat.  Cardiovascular: Denied cardiovascular symptoms, arrhythmia, chest pain/pressure, edema, exercise intolerance, orthopnea and palpitations.  Respiratory: Denied pulmonary symptoms, asthma, pleuritic pain, productive sputum, cough, dyspnea and wheezing.  Gastrointestinal: Denied, gastro-esophageal reflux, melena, nausea and vomiting.  Genitourinary: Denied genitourinary symptoms including symptomatic vaginal discharge, pelvic relaxation issues, and urinary complaints.  Musculoskeletal: Denied musculoskeletal symptoms, stiffness, swelling, muscle weakness and myalgia.  Dermatologic: Denied dermatology symptoms, rash and scar.  Neurologic: Denied neurology symptoms, dizziness, headache, neck pain and syncope.  Psychiatric: Denied psychiatric symptoms, anxiety and depression.  Endocrine: See HPI for additional information.   Meds:   Current Outpatient Medications on File Prior to Visit  Medication Sig Dispense Refill   cetirizine (ZYRTEC) 10 MG tablet TAKE 1 TABLET (10 MG TOTAL) BY MOUTH DAILY. 90 tablet 1   Cholecalciferol (D3 ADULT PO) Take 1,000 Units by mouth daily.     fluticasone (FLONASE) 50 MCG/ACT nasal spray Place 1 spray into both nostrils 2 (two) times daily. 16 g 5   lisinopril (ZESTRIL) 20 MG tablet TAKE ONE TABLET BY MOUTH DAILY FOR BLOOD PRESSURE 90 tablet 3   Multiple Vitamin (MULTIVITAMIN) tablet Take 1 tablet by mouth daily.     sertraline (ZOLOFT) 50 MG tablet Take 1 tablet (50 mg total) by mouth daily. For anxiety. 90 tablet 2   No current facility-administered medications on file prior to visit.      Objective:     Vitals:  02/01/21 1036  BP: (!) 141/65  Pulse: 100  SpO2: 100%   Filed Weights   02/01/21 1036  Weight: 218 lb (98.9 kg)                        Assessment:     G3P3003 Patient Active Problem List   Diagnosis Date Noted   Frequent bowel movements 01/25/2021   Hot flashes 01/25/2021   Acute non-recurrent sinusitis 04/25/2020   Dysuria 03/10/2020   Fatigue 08/04/2018   Rash and nonspecific skin eruption 08/13/2016   Chronic right shoulder pain 01/09/2016   Cystocele, midline 10/18/2015   Anxiety and depression 09/27/2015   Borderline diabetes 03/29/2015   Vitamin D deficiency 03/29/2015   Rhinitis, allergic 03/29/2015   Preventative health care 03/29/2015   Low back pain 03/29/2015   Cystocele 03/08/2015   Increased BMI 03/08/2015   Vaginal atrophy 03/08/2015   Recurrent UTI 03/08/2015   Unstable bladder 03/08/2015   Essential hypertension 08/25/2014   Migraines 08/25/2014   Plantar fasciitis 08/25/2014     1. Symptomatic menopausal or female climacteric states        Plan:            1.  HRT I have discussed HRT with the patient in detail.  The risk/benefits of it were reviewed.  She understands that during menopause Estrogen decreases dramatically and that this results in an increased risk of cardiovascular disease as well as osteoporosis.  We have also discussed the fact that hot flashes often result from a decrease in Estrogen, and that by replacing Estrogen, they can often be alleviated.  We have discussed skin, vaginal and urinary tract changes that may also take place from this drop in Estrogen.  Emotional changes have also been linked to Estrogen and we have briefly discussed this.  The benefits of HRT including decrease in hot flashes, vaginal dryness, and osteoporosis were discussed.  The emotional benefit and a possible change in her cardiovascular risk profile was also reviewed.  The risks associated with Hormone Replacement Therapy were also reviewed.  The use of unopposed Estrogen and its relationship to endometrial cancer was discussed.  The addition of Progesterone and its beneficial effect on endometrial cancer was also  noted.  The fact that there has been no consistent definitive studies showing an increase in breast cancer in women who use HRT was discussed with the patient.  The possible side effects including breast tenderness, fluid retention, mood changes and vaginal bleeding were discussed.  The patient was informed that this is an elective medication and that she may choose not to take Hormone Replacement Therapy.  Literature on HRT was made available, and I believe that after answering all of the patients questions.  Special emphasis on the WHI study, as well as several studies since that pertaining to the risks and benefits of estrogen replacement therapy were compared.  The possible limitations of these studies were discussed including the age stratification of the WHI study.  The possible increased role of Progesterone in these studies was discussed in detail.  Different types of hormone formulation and methods of taking hormone replacement therapy were discussed. Literature on HRT was made available, and I believe that after answering all of the patients questions she has an adequate and informed understanding of the risks and benefits of HRT. We have specifically discussed some of the issues and starting HRT after multiple years of menopause without HRT.  This is somewhat mitigated by  her use of HRT during menopause but not completely.  I suspect that many of her risks will be reduced over baseline but again these risks are not 0 including the increased risk of breast cancer, heart disease and stroke.  All of her questions have been answered. I will start Activella.  Plan follow-up at next annual exam in May. Orders No orders of the defined types were placed in this encounter.    Meds ordered this encounter  Medications   estradiol-norethindrone (ACTIVELLA) 1-0.5 MG tablet    Sig: Take 1 tablet by mouth daily.    Dispense:  90 tablet    Refill:  1      F/U  Return for Annual Physical. I spent 33  minutes involved in the care of this patient preparing to see the patient by obtaining and reviewing her medical history (including labs, imaging tests and prior procedures), documenting clinical information in the electronic health record (EHR), counseling and coordinating care plans, writing and sending prescriptions, ordering tests or procedures and in direct communicating with the patient and medical staff discussing pertinent items from her history and physical exam.  Elonda Husky, M.D. 02/01/2021 11:17 AM

## 2021-02-21 DIAGNOSIS — J019 Acute sinusitis, unspecified: Secondary | ICD-10-CM | POA: Diagnosis not present

## 2021-04-17 ENCOUNTER — Other Ambulatory Visit: Payer: Self-pay | Admitting: Primary Care

## 2021-04-17 DIAGNOSIS — I10 Essential (primary) hypertension: Secondary | ICD-10-CM

## 2021-04-18 NOTE — Telephone Encounter (Signed)
Patient due for CPE or annual follow up for medication refills. ?Please schedule for May 2023. ?

## 2021-04-22 DIAGNOSIS — J301 Allergic rhinitis due to pollen: Secondary | ICD-10-CM | POA: Diagnosis not present

## 2021-04-22 DIAGNOSIS — J018 Other acute sinusitis: Secondary | ICD-10-CM | POA: Diagnosis not present

## 2021-04-26 NOTE — Telephone Encounter (Signed)
Called patient she was on vacation she will call next week to make appointment.  ?

## 2021-04-28 ENCOUNTER — Other Ambulatory Visit: Payer: Self-pay | Admitting: Primary Care

## 2021-04-28 DIAGNOSIS — F32A Depression, unspecified: Secondary | ICD-10-CM

## 2021-04-28 NOTE — Telephone Encounter (Signed)
Patient due for general follow up. ?Please schedule. ?

## 2021-05-03 NOTE — Telephone Encounter (Signed)
Patient has follow up scheduled no further action needed.  ?

## 2021-05-15 ENCOUNTER — Ambulatory Visit (INDEPENDENT_AMBULATORY_CARE_PROVIDER_SITE_OTHER): Payer: BLUE CROSS/BLUE SHIELD | Admitting: Obstetrics and Gynecology

## 2021-05-15 ENCOUNTER — Encounter: Payer: Self-pay | Admitting: Obstetrics and Gynecology

## 2021-05-15 VITALS — BP 117/73 | HR 86 | Ht 66.0 in | Wt 213.4 lb

## 2021-05-15 DIAGNOSIS — Z01419 Encounter for gynecological examination (general) (routine) without abnormal findings: Secondary | ICD-10-CM | POA: Diagnosis not present

## 2021-05-15 DIAGNOSIS — N951 Menopausal and female climacteric states: Secondary | ICD-10-CM

## 2021-05-15 DIAGNOSIS — Z79890 Hormone replacement therapy (postmenopausal): Secondary | ICD-10-CM

## 2021-05-15 DIAGNOSIS — Z1231 Encounter for screening mammogram for malignant neoplasm of breast: Secondary | ICD-10-CM | POA: Diagnosis not present

## 2021-05-15 MED ORDER — ESTRADIOL-NORETHINDRONE ACET 1-0.5 MG PO TABS: 1.0000 | ORAL_TABLET | Freq: Every day | ORAL | 3 refills | Status: DC

## 2021-05-15 NOTE — Progress Notes (Signed)
HPI: ?     [name] is a 61 y.o. G3P3003 who LMP was No LMP recorded. Patient is postmenopausal. ? ?Subjective:  ? ?She presents today for her annual examination.  She says that her hot flashes have now resolved on the Activella.  She likes it and would like to stay on it.  She reports no vaginal bleeding and has no complaints today. ?  Hx: ?The following portions of the patient's history were reviewed and updated as appropriate: ?            She  has a past medical history of Allergy, Hypertension, Migraine, Postmenopausal bleeding (2014), and Vaginal atrophy. ?She does not have any pertinent problems on file. ?She  has a past surgical history that includes Dilation and curettage of uterus (2014); LASIK (Bilateral); Breast biopsy (Left, 1987); Breast excisional biopsy; and Tubal ligation. ?Her family history includes Alcohol abuse in her father and mother; COPD in her maternal grandmother; Diabetes in her brother, father, and mother; Drug abuse in her brother; Hypertension in her brother. ?She  reports that she has never smoked. She has never used smokeless tobacco. She reports that she does not drink alcohol and does not use drugs. ?She has a current medication list which includes the following prescription(s): cetirizine, cholecalciferol, fluticasone, lisinopril, multivitamin, sertraline, and estradiol-norethindrone. ?She has No Known Allergies. ?      ?Review of Systems:  ?Review of Systems ? ?Constitutional: Denied constitutional symptoms, night sweats, recent illness, fatigue, fever, insomnia and weight loss.  ?Eyes: Denied eye symptoms, eye pain, photophobia, vision change and visual disturbance.  ?Ears/Nose/Throat/Neck: Denied ear, nose, throat or neck symptoms, hearing loss, nasal discharge, sinus congestion and sore throat.  ?Cardiovascular: Denied cardiovascular symptoms, arrhythmia, chest pain/pressure, edema, exercise intolerance, orthopnea and palpitations.  ?Respiratory: Denied pulmonary  symptoms, asthma, pleuritic pain, productive sputum, cough, dyspnea and wheezing.  ?Gastrointestinal: Denied, gastro-esophageal reflux, melena, nausea and vomiting.  ?Genitourinary: Denied genitourinary symptoms including symptomatic vaginal discharge, pelvic relaxation issues, and urinary complaints.  ?Musculoskeletal: Denied musculoskeletal symptoms, stiffness, swelling, muscle weakness and myalgia.  ?Dermatologic: Denied dermatology symptoms, rash and scar.  ?Neurologic: Denied neurology symptoms, dizziness, headache, neck pain and syncope.  ?Psychiatric: Denied psychiatric symptoms, anxiety and depression.  ?Endocrine: Denied endocrine symptoms including hot flashes and night sweats.  ? ?Meds: ?  ?Current Outpatient Medications on File Prior to Visit  ?Medication Sig Dispense Refill  ? cetirizine (ZYRTEC) 10 MG tablet TAKE 1 TABLET (10 MG TOTAL) BY MOUTH DAILY. 90 tablet 1  ? Cholecalciferol (D3 ADULT PO) Take 1,000 Units by mouth daily.    ? fluticasone (FLONASE) 50 MCG/ACT nasal spray Place 1 spray into both nostrils 2 (two) times daily. 16 g 5  ? lisinopril (ZESTRIL) 20 MG tablet TAKE ONE TABLET BY MOUTH DAILY FOR BLOOD PRESSURE. Office visit required for further refills. 90 tablet 0  ? Multiple Vitamin (MULTIVITAMIN) tablet Take 1 tablet by mouth daily.    ? sertraline (ZOLOFT) 50 MG tablet Take 1 tablet (50 mg total) by mouth daily. For anxiety and depression. Office visit required for further refills. 90 tablet 0  ? ?No current facility-administered medications on file prior to visit.  ? ? ? ?Objective:  ?  ? ?Vitals:  ? 05/15/21 1339  ?BP: 117/73  ?Pulse: 86  ?  ?Filed Weights  ? 05/15/21 1339  ?Weight: 213 lb 6.4 oz (96.8 kg)  ? ?  ?         Physical examination ?General NAD, Conversant  ?  HEENT Atraumatic; Op clear with mmm.  Normo-cephalic. Pupils reactive. Anicteric sclerae  ?Thyroid/Neck Smooth without nodularity or enlargement. Normal ROM.  Neck Supple.  ?Skin No rashes, lesions or ulceration. Normal  palpated skin turgor. No nodularity.  ?Breasts: No masses or discharge.  Symmetric.  No axillary adenopathy.  ?Lungs: Clear to auscultation.No rales or wheezes. Normal Respiratory effort, no retractions.  ?Heart: NSR.  No murmurs or rubs appreciated. No periferal edema  ?Abdomen: Soft.  Non-tender.  No masses.  No HSM. No hernia  ?Extremities: Moves all appropriately.  Normal ROM for age. No lymphadenopathy.  ?Neuro: Oriented to PPT.  Normal mood. Normal affect.  ? ?  Pelvic:   ?Vulva: Normal appearance.  No lesions.  ?Vagina: No lesions or abnormalities noted.  ?Support: Normal pelvic support.  ?Urethra No masses tenderness or scarring.  ?Meatus Normal size without lesions or prolapse.  ?Cervix: Normal appearance.  No lesions.  ?Anus: Normal exam.  No lesions.  ?Perineum: Normal exam.  No lesions.  ?      Bimanual   ?Uterus: Normal size.  Non-tender.  Mobile.  AV.  ?Adnexae: No masses.  Non-tender to palpation.  ?Cul-de-sac: Negative for abnormality.  ? ? ? ?Assessment:  ?  ?G3P3003 ?Patient Active Problem List  ? Diagnosis Date Noted  ? Frequent bowel movements 01/25/2021  ? Hot flashes 01/25/2021  ? Acute non-recurrent sinusitis 04/25/2020  ? Dysuria 03/10/2020  ? Fatigue 08/04/2018  ? Rash and nonspecific skin eruption 08/13/2016  ? Chronic right shoulder pain 01/09/2016  ? Cystocele, midline 10/18/2015  ? Anxiety and depression 09/27/2015  ? Borderline diabetes 03/29/2015  ? Vitamin D deficiency 03/29/2015  ? Rhinitis, allergic 03/29/2015  ? Preventative health care 03/29/2015  ? Low back pain 03/29/2015  ? Cystocele 03/08/2015  ? Increased BMI 03/08/2015  ? Vaginal atrophy 03/08/2015  ? Recurrent UTI 03/08/2015  ? Unstable bladder 03/08/2015  ? Essential hypertension 08/25/2014  ? Migraines 08/25/2014  ? Plantar fasciitis 08/25/2014  ? ?  ?1. Well woman exam with routine gynecological exam   ?2. Symptomatic menopausal or female climacteric states   ?3. Screening mammogram for breast cancer   ?4. Hormone  replacement therapy (HRT)   ? ? HRT going very well. ? ? ?Plan:  ?  ?       ? 1.  Basic Screening Recommendations ?The basic screening recommendations for asymptomatic women were discussed with the patient during her visit.  The age-appropriate recommendations were discussed with her and the rational for the tests reviewed.  When I am informed by the patient that another primary care physician has previously obtained the age-appropriate tests and they are up-to-date, only outstanding tests are ordered and referrals given as necessary.  Abnormal results of tests will be discussed with her when all of her results are completed.  Routine preventative health maintenance measures emphasized: Exercise/Diet/Weight control, Tobacco Warnings, Alcohol/Substance use risks and Stress Management ?Mammogram ordered ?2.  Continue HRT. ?Orders ?Orders Placed This Encounter  ?Procedures  ? MM DIGITAL SCREENING BILATERAL  ? ?  ?Meds ordered this encounter  ?Medications  ? estradiol-norethindrone (ACTIVELLA) 1-0.5 MG tablet  ?  Sig: Take 1 tablet by mouth daily.  ?  Dispense:  90 tablet  ?  Refill:  3  ?    ? ? ?  ?  F/U ? No follow-ups on file. ? ?Elonda Husky, M.D. ?05/15/2021 ?2:40 PM ? ? ? ?

## 2021-05-15 NOTE — Progress Notes (Signed)
Patients presents for annual exam today. Patient states hot flashes and night sweats are much better since starting Activella.Patient is up to date on pap smear.  Patient is due for mammogram, ordered. Patients annual labs were declined. Patient states no other questions or concerns at this time.   ?

## 2021-05-24 ENCOUNTER — Ambulatory Visit: Payer: BLUE CROSS/BLUE SHIELD | Admitting: Primary Care

## 2021-05-24 ENCOUNTER — Encounter: Payer: Self-pay | Admitting: Primary Care

## 2021-05-24 VITALS — BP 118/70 | HR 86 | Ht 66.0 in | Wt 214.2 lb

## 2021-05-24 DIAGNOSIS — R232 Flushing: Secondary | ICD-10-CM

## 2021-05-24 DIAGNOSIS — F419 Anxiety disorder, unspecified: Secondary | ICD-10-CM

## 2021-05-24 DIAGNOSIS — F32A Depression, unspecified: Secondary | ICD-10-CM

## 2021-05-24 DIAGNOSIS — J309 Allergic rhinitis, unspecified: Secondary | ICD-10-CM | POA: Diagnosis not present

## 2021-05-24 DIAGNOSIS — I10 Essential (primary) hypertension: Secondary | ICD-10-CM

## 2021-05-24 DIAGNOSIS — R7303 Prediabetes: Secondary | ICD-10-CM

## 2021-05-24 LAB — LIPID PANEL
Cholesterol: 156 mg/dL (ref 0–200)
HDL: 35 mg/dL — ABNORMAL LOW (ref >39.00)
LDL Cholesterol: 96 mg/dL (ref 0–99)
NonHDL: 121.28
Total CHOL/HDL Ratio: 4
Triglycerides: 126 mg/dL (ref 0.0–149.0)
VLDL: 25.2 mg/dL (ref 0.0–40.0)

## 2021-05-24 LAB — BASIC METABOLIC PANEL
BUN: 13 mg/dL (ref 6–23)
CO2: 28 mEq/L (ref 19–32)
Calcium: 9.4 mg/dL (ref 8.4–10.5)
Chloride: 106 mEq/L (ref 96–112)
Creatinine, Ser: 1.01 mg/dL (ref 0.40–1.20)
GFR: 59.63 mL/min — ABNORMAL LOW (ref >60.00)
Glucose, Bld: 97 mg/dL (ref 70–99)
Potassium: 4 mEq/L (ref 3.5–5.1)
Sodium: 139 mEq/L (ref 135–145)

## 2021-05-24 LAB — HEMOGLOBIN A1C: Hgb A1c MFr Bld: 5.7 % (ref 4.6–6.5)

## 2021-05-24 NOTE — Progress Notes (Signed)
Subjective:    Patient ID: Meagan Johnson, female    DOB: 1960/01/16, 61 y.o.   MRN: [id number]  HPI  Gilda Abboud is a very pleasant 61 y.o. female with a history of hypertension, migraines, hot flashes, overactive bladder, anxiety and depression who presents today for follow up of chronic conditions.  1) Hypertension: Currently managed on lisinopril 20 mg daily. She does not check her BP at home.   She denies chest pain, shortness of breath, dizziness.   BP Readings from Last 3 Encounters:  05/24/21 118/70  05/15/21 117/73  02/01/21 (!) 141/65   2) Anxiety and Depression: Currently managed on sertraline 50 mg daily. Overall improved, occasional breakthrough symptoms of mind racing thoughts and worrying.   She denies SI/HI.   3) Hot Flashes: Currently managed on Activella 1-0.5 mg OCP's. Follows with GYN, last office visit was last week. Overall hot flashes have improved. She has a few at night but overall much less than previously.  4) Seasonal Allergies: Chronic, worse during Spring and Fall changes. Currently managed on Zyrtec 10 mg daily. She was evaluated by an urgent care in [place] for acute sinusitis symptoms, prescribed Aztelin nasal spray and an antibiotic, improved.   She is not using a nasal spray now but continues to notice congestion.    Review of Systems  HENT:  Positive for congestion and postnasal drip.   Respiratory:  Negative for shortness of breath.   Cardiovascular:  Negative for chest pain.  Allergic/Immunologic: Positive for environmental allergies.  Neurological:  Positive for headaches. Negative for dizziness.  Psychiatric/Behavioral:  The patient is not nervous/anxious.         Past Medical History:  Diagnosis Date   Allergy    Hypertension    Migraine    Postmenopausal bleeding 2014   Vaginal atrophy     Social History   Socioeconomic History   Marital status: Widowed    Spouse name: Not on file   Number of children: Not on  file   Years of education: Not on file   Highest education level: Not on file  Occupational History   Not on file  Tobacco Use   Smoking status: Never   Smokeless tobacco: Never  Vaping Use   Vaping Use: Never used  Substance and Sexual Activity   Alcohol use: No    Alcohol/week: 0.0 standard drinks   Drug use: No   Sexual activity: Yes    Birth control/protection: Post-menopausal  Other Topics Concern   Not on file  Social History Narrative   Not on file   Social Determinants of Health   Financial Resource Strain: Not on file  Food Insecurity: Not on file  Transportation Needs: Not on file  Physical Activity: Not on file  Stress: Not on file  Social Connections: Not on file  Intimate Partner Violence: Not on file    Past Surgical History:  Procedure Laterality Date   BREAST BIOPSY Left 1987   neg   BREAST EXCISIONAL BIOPSY     DILATION AND CURETTAGE OF UTERUS  2014   LASIK Bilateral    TUBAL LIGATION      Family History  Problem Relation Age of Onset   Alcohol abuse Mother    Diabetes Mother    Alcohol abuse Father    Diabetes Father    Drug abuse Brother    Hypertension Brother    Diabetes Brother    COPD Maternal Grandmother    Cancer Neg Hx  Breast cancer Neg Hx     No Known Allergies  Current Outpatient Medications on File Prior to Visit  Medication Sig Dispense Refill   cetirizine (ZYRTEC) 10 MG tablet TAKE 1 TABLET (10 MG TOTAL) BY MOUTH DAILY. 90 tablet 1   Cholecalciferol (D3 ADULT PO) Take 1,000 Units by mouth daily.     estradiol-norethindrone (ACTIVELLA) 1-0.5 MG tablet Take 1 tablet by mouth daily. 90 tablet 3   fluticasone (FLONASE) 50 MCG/ACT nasal spray Place 1 spray into both nostrils 2 (two) times daily. 16 g 5   lisinopril (ZESTRIL) 20 MG tablet TAKE ONE TABLET BY MOUTH DAILY FOR BLOOD PRESSURE. Office visit required for further refills. 90 tablet 0   Multiple Vitamin (MULTIVITAMIN) tablet Take 1 tablet by mouth daily.      sertraline (ZOLOFT) 50 MG tablet Take 1 tablet (50 mg total) by mouth daily. For anxiety and depression. Office visit required for further refills. 90 tablet 0   No current facility-administered medications on file prior to visit.    BP 118/70   Pulse 86   Ht 5\' 6"  (1.676 m)   Wt 214 lb 3.2 oz (97.2 kg)   SpO2 93%   BMI 34.57 kg/m  Objective:   Physical Exam Cardiovascular:     Rate and Rhythm: Normal rate and regular rhythm.  Pulmonary:     Effort: Pulmonary effort is normal.     Breath sounds: Normal breath sounds.  Abdominal:     Palpations: Abdomen is soft.     Tenderness: There is no abdominal tenderness.  Musculoskeletal:     Cervical back: Neck supple.  Skin:    General: Skin is warm and dry.  Psychiatric:        Mood and Affect: Mood normal.          Assessment & Plan:      This visit occurred during the SARS-CoV-2 public health emergency.  Safety protocols were in place, including screening questions prior to the visit, additional usage of staff PPE, and extensive cleaning of exam room while observing appropriate contact time as indicated for disinfecting solutions.

## 2021-05-24 NOTE — Assessment & Plan Note (Signed)
Discussed the importance of a healthy diet and regular exercise in order for weight loss, and to reduce the risk of further co-morbidity. ? ?Repeat A1C pending. ?

## 2021-05-24 NOTE — Assessment & Plan Note (Signed)
Controlled.  Continue lisinopril 20 mg daily. BMP pending

## 2021-05-24 NOTE — Assessment & Plan Note (Signed)
Overall controlled, patient satisfied with treatment. Continue Zoloft 50 mg daily.

## 2021-05-24 NOTE — Assessment & Plan Note (Signed)
Uncontrolled.  Continue Zyrtec 10 mg HS. Add Astelin nasal spray, she has a full bottle at home.

## 2021-05-24 NOTE — Patient Instructions (Signed)
Stop by the lab prior to leaving today. I will notify you of your results once received.   Resume Astelin nasal spray for allergies. Continue Zyrtec.  It was a pleasure to see you today!

## 2021-05-24 NOTE — Assessment & Plan Note (Signed)
Controlled.  Continue OCP's daily per GYN.

## 2021-06-21 ENCOUNTER — Other Ambulatory Visit: Payer: Self-pay | Admitting: Obstetrics and Gynecology

## 2021-06-21 DIAGNOSIS — Z1231 Encounter for screening mammogram for malignant neoplasm of breast: Secondary | ICD-10-CM

## 2021-07-13 ENCOUNTER — Other Ambulatory Visit: Payer: Self-pay | Admitting: Primary Care

## 2021-07-13 DIAGNOSIS — I10 Essential (primary) hypertension: Secondary | ICD-10-CM

## 2021-07-17 ENCOUNTER — Ambulatory Visit
Admission: RE | Admit: 2021-07-17 | Discharge: 2021-07-17 | Disposition: A | Discharge status: Home / Self Care | Payer: BLUE CROSS/BLUE SHIELD | Source: Ambulatory Visit | Attending: Obstetrics and Gynecology | Admitting: Obstetrics and Gynecology

## 2021-07-17 DIAGNOSIS — Z1231 Encounter for screening mammogram for malignant neoplasm of breast: Secondary | ICD-10-CM | POA: Diagnosis not present

## 2021-07-18 ENCOUNTER — Other Ambulatory Visit: Payer: Self-pay | Admitting: Obstetrics and Gynecology

## 2021-07-18 DIAGNOSIS — R928 Other abnormal and inconclusive findings on diagnostic imaging of breast: Secondary | ICD-10-CM

## 2021-07-18 DIAGNOSIS — N6489 Other specified disorders of breast: Secondary | ICD-10-CM

## 2021-07-23 ENCOUNTER — Telehealth: Payer: Self-pay | Admitting: Obstetrics and Gynecology

## 2021-07-23 ENCOUNTER — Encounter: Payer: Self-pay | Admitting: Obstetrics and Gynecology

## 2021-07-23 NOTE — Telephone Encounter (Signed)
Addressed via MyChart

## 2021-07-23 NOTE — Telephone Encounter (Signed)
Pt states she received a letter stating she needs to have a follow up mammogram. Pt states she is unable to make that appointment until the order has been placed. Pt is requesting an order .

## 2021-07-26 ENCOUNTER — Other Ambulatory Visit: Payer: Self-pay | Admitting: Primary Care

## 2021-07-26 DIAGNOSIS — F32A Depression, unspecified: Secondary | ICD-10-CM

## 2021-08-17 ENCOUNTER — Ambulatory Visit
Admission: RE | Admit: 2021-08-17 | Discharge: 2021-08-17 | Disposition: A | Discharge status: Home / Self Care | Payer: BLUE CROSS/BLUE SHIELD | Source: Ambulatory Visit | Attending: Obstetrics and Gynecology | Admitting: Obstetrics and Gynecology

## 2021-08-17 DIAGNOSIS — R928 Other abnormal and inconclusive findings on diagnostic imaging of breast: Secondary | ICD-10-CM

## 2021-08-17 DIAGNOSIS — N6489 Other specified disorders of breast: Secondary | ICD-10-CM | POA: Insufficient documentation

## 2021-08-17 DIAGNOSIS — N6323 Unspecified lump in the left breast, lower outer quadrant: Secondary | ICD-10-CM | POA: Diagnosis not present

## 2021-08-17 DIAGNOSIS — N6321 Unspecified lump in the left breast, upper outer quadrant: Secondary | ICD-10-CM | POA: Diagnosis not present

## 2021-08-28 DIAGNOSIS — N329 Bladder disorder, unspecified: Secondary | ICD-10-CM | POA: Diagnosis not present

## 2021-09-05 DIAGNOSIS — D3610 Benign neoplasm of peripheral nerves and autonomic nervous system, unspecified: Secondary | ICD-10-CM | POA: Diagnosis not present

## 2021-09-05 DIAGNOSIS — L989 Disorder of the skin and subcutaneous tissue, unspecified: Secondary | ICD-10-CM | POA: Diagnosis not present

## 2021-09-05 DIAGNOSIS — D225 Melanocytic nevi of trunk: Secondary | ICD-10-CM | POA: Diagnosis not present

## 2021-09-05 DIAGNOSIS — L821 Other seborrheic keratosis: Secondary | ICD-10-CM | POA: Diagnosis not present

## 2021-10-12 DIAGNOSIS — N8184 Pelvic muscle wasting: Secondary | ICD-10-CM | POA: Diagnosis not present

## 2021-11-04 DIAGNOSIS — J018 Other acute sinusitis: Secondary | ICD-10-CM | POA: Diagnosis not present

## 2022-01-02 ENCOUNTER — Other Ambulatory Visit: Payer: Self-pay | Admitting: Obstetrics and Gynecology

## 2022-01-02 DIAGNOSIS — N63 Unspecified lump in unspecified breast: Secondary | ICD-10-CM

## 2022-01-14 ENCOUNTER — Telehealth: Payer: Self-pay

## 2022-01-14 NOTE — Telephone Encounter (Signed)
Pt needs orders for a follow up mammogram. Pt states [facility] is waiting on [facility] to sign off on this.

## 2022-01-15 NOTE — Telephone Encounter (Addendum)
Patient following up on request for mammogram and breast u/s orders. Advised Dr. Amalia Hailey was in the OR yesterday and is currently in the OR, but will return to office today.

## 2022-01-17 NOTE — Telephone Encounter (Signed)
Done by Johney Frame

## 2022-01-30 ENCOUNTER — Ambulatory Visit: Payer: BLUE CROSS/BLUE SHIELD | Admitting: Primary Care

## 2022-02-02 DIAGNOSIS — J0191 Acute recurrent sinusitis, unspecified: Secondary | ICD-10-CM | POA: Diagnosis not present

## 2022-02-03 ENCOUNTER — Other Ambulatory Visit: Payer: Self-pay

## 2022-02-03 ENCOUNTER — Emergency Department
Admission: EM | Admit: 2022-02-03 | Discharge: 2022-02-03 | Disposition: A | Discharge status: Home / Self Care | Payer: BC Managed Care – PPO | Attending: Emergency Medicine | Admitting: Emergency Medicine

## 2022-02-03 ENCOUNTER — Emergency Department: Payer: BC Managed Care – PPO

## 2022-02-03 DIAGNOSIS — I10 Essential (primary) hypertension: Secondary | ICD-10-CM | POA: Insufficient documentation

## 2022-02-03 DIAGNOSIS — G43809 Other migraine, not intractable, without status migrainosus: Secondary | ICD-10-CM | POA: Insufficient documentation

## 2022-02-03 DIAGNOSIS — Z20822 Contact with and (suspected) exposure to covid-19: Secondary | ICD-10-CM | POA: Insufficient documentation

## 2022-02-03 DIAGNOSIS — R519 Headache, unspecified: Secondary | ICD-10-CM | POA: Diagnosis not present

## 2022-02-03 DIAGNOSIS — I6529 Occlusion and stenosis of unspecified carotid artery: Secondary | ICD-10-CM | POA: Diagnosis not present

## 2022-02-03 LAB — CBC WITH DIFFERENTIAL/PLATELET
Abs Immature Granulocytes: 0.02 10*3/uL (ref 0.00–0.07)
Basophils Absolute: 0 10*3/uL (ref 0.0–0.1)
Basophils Relative: 1 %
Eosinophils Absolute: 0 10*3/uL (ref 0.0–0.5)
Eosinophils Relative: 1 %
HCT: 39.8 % (ref 36.0–46.0)
Hemoglobin: 13.1 g/dL (ref 12.0–15.0)
Immature Granulocytes: 0 %
Lymphocytes Relative: 15 %
Lymphs Abs: 1.3 10*3/uL (ref 0.7–4.0)
MCH: 29.9 pg (ref 26.0–34.0)
MCHC: 32.9 g/dL (ref 30.0–36.0)
MCV: 90.9 fL (ref 80.0–100.0)
Monocytes Absolute: 0.6 10*3/uL (ref 0.1–1.0)
Monocytes Relative: 6 %
Neutro Abs: 6.8 10*3/uL (ref 1.7–7.7)
Neutrophils Relative %: 77 %
Platelets: 198 10*3/uL (ref 150–400)
RBC: 4.38 MIL/uL (ref 3.87–5.11)
RDW: 13.5 % (ref 11.5–15.5)
WBC: 8.7 10*3/uL (ref 4.0–10.5)
nRBC: 0 % (ref 0.0–0.2)

## 2022-02-03 LAB — RESP PANEL BY RT-PCR (RSV, FLU A&B, COVID)  RVPGX2
Influenza A by PCR: NEGATIVE
Influenza B by PCR: NEGATIVE
Resp Syncytial Virus by PCR: NEGATIVE
SARS Coronavirus 2 by RT PCR: NEGATIVE

## 2022-02-03 LAB — COMPREHENSIVE METABOLIC PANEL
ALT: 22 U/L (ref 0–44)
AST: 25 U/L (ref 15–41)
Albumin: 3.7 g/dL (ref 3.5–5.0)
Alkaline Phosphatase: 43 U/L (ref 38–126)
Anion gap: 9 (ref 5–15)
BUN: 9 mg/dL (ref 8–23)
CO2: 21 mmol/L — ABNORMAL LOW (ref 22–32)
Calcium: 8.7 mg/dL — ABNORMAL LOW (ref 8.9–10.3)
Chloride: 108 mmol/L (ref 98–111)
Creatinine, Ser: 0.79 mg/dL (ref 0.44–1.00)
GFR, Estimated: >60 mL/min (ref >60)
Glucose, Bld: 118 mg/dL — ABNORMAL HIGH (ref 70–99)
Potassium: 4 mmol/L (ref 3.5–5.1)
Sodium: 138 mmol/L (ref 135–145)
Total Bilirubin: 0.7 mg/dL (ref 0.3–1.2)
Total Protein: 7.2 g/dL (ref 6.5–8.1)

## 2022-02-03 MED ORDER — METOCLOPRAMIDE HCL 5 MG/ML IJ SOLN
10.0000 mg | Freq: Once | INTRAMUSCULAR | Status: AC
  Administered 2022-02-03: 10 mg via INTRAVENOUS
  Filled 2022-02-03: qty 2

## 2022-02-03 MED ORDER — DIPHENHYDRAMINE HCL 50 MG/ML IJ SOLN
50.0000 mg | Freq: Once | INTRAMUSCULAR | Status: AC
  Administered 2022-02-03: 50 mg via INTRAVENOUS
  Filled 2022-02-03: qty 1

## 2022-02-03 MED ORDER — KETOROLAC TROMETHAMINE 15 MG/ML IJ SOLN
15.0000 mg | Freq: Once | INTRAMUSCULAR | Status: AC
  Administered 2022-02-03: 15 mg via INTRAVENOUS
  Filled 2022-02-03: qty 1

## 2022-02-03 MED ORDER — IOHEXOL 350 MG/ML SOLN
75.0000 mL | Freq: Once | INTRAVENOUS | Status: AC | PRN
  Administered 2022-02-03: 75 mL via INTRAVENOUS

## 2022-02-03 MED ORDER — SODIUM CHLORIDE 0.9 % IV BOLUS
1000.0000 mL | Freq: Once | INTRAVENOUS | Status: AC
  Administered 2022-02-03: 1000 mL via INTRAVENOUS

## 2022-02-03 NOTE — ED Triage Notes (Signed)
Pt to ED from home for headache x 6 days. Pt states "it is so bad it is making me sick on my stomach". Pt called telehealth yesterday morning and they prescribed amox for a sinus infection. She states she is taking OTC meds for the headache with no relief. Pt is CAOx4 and in no acute distress and ambulatory in triage.

## 2022-02-03 NOTE — ED Provider Notes (Signed)
[facility] Provider Note  Patient Contact: 9:07 PM (approximate)   History   Headache (X6 days)   HPI  Meagan Johnson is a 62 y.o. female with a history of hypertension and migraines, presents to the emergency department with an atypical headache for the past 6 days.  Patient states that the pain primarily occurs behind her left eye.  Patient states that her typical migraines start with an aura and light sensitivity.  She reports that her current headache feels very different than her usual headaches.  She denies unsteadiness or dizziness.  She denies weakness in the upper and lower extremities.  No facial droop.  Patient has tried over-the-counter medications at home with no relief.  No chest pain, chest tightness or abdominal pain.      Physical Exam   Triage Vital Signs: ED Triage Vitals [02/03/22 2046]  Enc Vitals Group     BP (!) 149/70     Pulse Rate 90     Resp 16     Temp 98.4 F (36.9 C)     Temp Source Oral     SpO2 96 %     Weight 195 lb (88.5 kg)     Height 5\' 6"  (1.676 m)     Head Circumference      Peak Flow      Pain Score 10     Pain Loc      Pain Edu?      Excl. in [place]?     Most recent vital signs: Vitals:   02/03/22 2046  BP: (!) 149/70  Pulse: 90  Resp: 16  Temp: 98.4 F (36.9 C)  SpO2: 96%     General: Alert and in no acute distress. Eyes:  PERRL. EOMI. Head: No acute traumatic findings ENT:      Nose: No congestion/rhinnorhea.      Mouth/Throat: Mucous membranes are moist. Neck: No stridor. No cervical spine tenderness to palpation. Cardiovascular:  Good peripheral perfusion Respiratory: Normal respiratory effort without tachypnea or retractions. Lungs CTAB. Good air entry to the bases with no decreased or absent breath sounds. Gastrointestinal: Bowel sounds 4 quadrants. Soft and nontender to palpation. No guarding or rigidity. No palpable masses. No distention. No CVA tenderness. Musculoskeletal: Full  range of motion to all extremities.  Neurologic:  No gross focal neurologic deficits are appreciated.  Skin:   No rash noted    ED Results / Procedures / Treatments   Labs (all labs ordered are listed, but only abnormal results are displayed) Labs Reviewed  COMPREHENSIVE METABOLIC PANEL - Abnormal; Notable for the following components:      Result Value   CO2 21 (*)    Glucose, Bld 118 (*)    Calcium 8.7 (*)    All other components within normal limits  RESP PANEL BY RT-PCR (RSV, FLU A&B, COVID)  RVPGX2  CBC WITH DIFFERENTIAL/PLATELET        RADIOLOGY  I personally viewed and evaluated these images as part of my medical decision making, as well as reviewing the written report by the radiologist.  ED Provider Interpretation: CT angio head shows no acute abnormality   PROCEDURES:  Critical Care performed: No  Procedures   MEDICATIONS ORDERED IN ED: Medications  metoCLOPramide (REGLAN) injection 10 mg (10 mg Intravenous Given 02/03/22 2132)  diphenhydrAMINE (BENADRYL) injection 50 mg (50 mg Intravenous Given 02/03/22 2133)  sodium chloride 0.9 % bolus 1,000 mL (1,000 mLs Intravenous New Bag/Given 02/03/22 2133)  iohexol (  OMNIPAQUE) 350 MG/ML injection 75 mL (75 mLs Intravenous Contrast Given 02/03/22 2204)  ketorolac (TORADOL) 15 MG/ML injection 15 mg (15 mg Intravenous Given 02/03/22 2250)     IMPRESSION / MDM / ASSESSMENT AND PLAN / ED COURSE  I reviewed the triage vital signs and the nursing notes.                              Assessment and plan: Headache:  62 year old female presents to the emergency department with an atypical headache for the past 6 days.  Vital signs are reassuring at triage.  On exam, patient was alert and nontoxic-appearing with no perceived neurodeficits.  CBC and CMP reassuring.  COVID-19, influenza and RSV negative.  CT angio head with and without contrast shows no acute abnormality.  Patient received migraine cocktail and patient's  headache resolved.  Return precautions were given to return with new or worsening symptoms.  All patient questions were answered.      FINAL CLINICAL IMPRESSION(S) / ED DIAGNOSES   Final diagnoses:  Other migraine without status migrainosus, not intractable     Rx / DC Orders   ED Discharge Orders     None        Note:  This document was prepared using Dragon voice recognition software and may include unintentional dictation errors.   Vallarie Mare Ogden, PA-C 02/03/22 2312    Harvest Dark, MD 02/04/22 [phone]

## 2022-02-05 ENCOUNTER — Telehealth: Payer: Self-pay

## 2022-02-05 ENCOUNTER — Ambulatory Visit
Admission: EM | Admit: 2022-02-05 | Discharge: 2022-02-05 | Disposition: A | Discharge status: Home / Self Care | Payer: BC Managed Care – PPO

## 2022-02-05 DIAGNOSIS — H6993 Unspecified Eustachian tube disorder, bilateral: Secondary | ICD-10-CM

## 2022-02-05 DIAGNOSIS — J3489 Other specified disorders of nose and nasal sinuses: Secondary | ICD-10-CM | POA: Diagnosis not present

## 2022-02-05 DIAGNOSIS — G43911 Migraine, unspecified, intractable, with status migrainosus: Secondary | ICD-10-CM

## 2022-02-05 MED ORDER — SUMATRIPTAN SUCCINATE 100 MG PO TABS: 100.0000 mg | ORAL_TABLET | Freq: Once | ORAL | 0 refills | Status: DC

## 2022-02-05 MED ORDER — PREDNISONE 20 MG PO TABS: 40.0000 mg | ORAL_TABLET | Freq: Every day | ORAL | 0 refills | Status: AC

## 2022-02-05 MED ORDER — FLUTICASONE PROPIONATE 50 MCG/ACT NA SUSP: 1.0000 | Freq: Every day | NASAL | 0 refills | Status: AC

## 2022-02-05 NOTE — Telephone Encounter (Signed)
[facility] Night - Client Nonclinical Telephone Record  [facility] Client [facility] Night - Client Client Site [place] - Night Provider Alma Friendly - NP Contact Type Call Who Is Calling Patient / Member / Family / Caregiver Caller Name Jenika Chiem Caller Phone Number [phone] Patient Name Meagan Johnson Patient DOB Mar 20, 1960 Call Type Message Only Information Provided Reason for Call Request to Schedule Office Appointment Initial Comment Caller states she was at the ER Sunday night, needs a follow up. Not COVID nor RSV, but still feeling sick. Patient request to speak to RN No Additional Comment Declined triage Disp. Time Disposition Final User 02/05/2022 7:48:35 AM General Information Provided Yes Dara Hoyer Call Closed By: Dara Hoyer Transaction Date/Time: 02/05/2022 7:46:21 AM (ET   Per chart review tab pt is presently at [facility], sending note to [facility] support.

## 2022-02-05 NOTE — ED Triage Notes (Signed)
Patient to Urgent Care with complaints of headache. Reports the left side of her face/ left ear are throbbing. Symptoms started one week ago. Hx of migraines.   Televisit Saturday morning and prescribed Augmentin for a sinus infection. Sunday seen in ER with negative work up. Reports receiving benadryl/ Toradol/ Reglan with little improvement. Reports feeling even worse today  Rotating tylenol and motrin. Max temp 99.

## 2022-02-05 NOTE — Discharge Instructions (Signed)
Flonase daily Over-the-counter allergy medicine such as Claritin or Allegra as you been on Zyrtec for some time Continue Augmentin as previously prescribed Trial of Imitrex for migraine.  May repeat dose in 2 hours if your symptoms persist Prednisone daily Continue nasal rinses Rest and fluids Follow-up with your PCP 2 to 3 days for recheck Please go the emergency room if you have any worsening symptoms

## 2022-02-05 NOTE — Telephone Encounter (Signed)
Called patient and she stated she is at urgent care.

## 2022-02-05 NOTE — ED Provider Notes (Signed)
Meagan Johnson    CSN: [id number] Arrival date & time: 02/05/22  3716      History   Chief Complaint Chief Complaint  Patient presents with   Nasal Congestion   Headache    HPI Meagan Johnson is a 62 y.o. female resents for evaluation of headache, sinus pressure, ear pain.  Patient reports 1 week of a frontal headache that is now left-sided behind Meagan Johnson left eye.  Meagan Johnson endorses bilateral sinus pressure with puffy eyes.  Today Meagan Johnson had chills with a low-grade fever of 99 degrees as well as left ear pain.  Patient did do a televisit a couple days ago and was prescribed Augmentin for sinus infection.  Meagan Johnson has been taking this without improvement.  In addition Meagan Johnson did go to the emergency room on 1/28 due to the severity of Meagan Johnson headache.  Meagan Johnson had a negative CT of Meagan Johnson head as well as a negative flu, COVID, RSV PCR.  Meagan Johnson was given migraine cocktail with Benadryl, Reglan, and ketorolac with minimal improvement in Meagan Johnson symptoms.  Meagan Johnson is continuing to have this headache that slightly improves with over-the-counter analgesics.  Meagan Johnson does have a history of migraines but has not had one in several years.  Meagan Johnson does not have any rescue medications.  Denies that this is the worst headache of Meagan Johnson life.  Meagan Johnson did take ibuprofen prior to coming to clinic.  Denies any cough and reports minimal congestion.  No other concerns at this time.   Headache Associated symptoms: congestion and ear pain     Past Medical History:  Diagnosis Date   Allergy    Frequent bowel movements 01/25/2021   Hypertension    Migraine    Postmenopausal bleeding 2014   Vaginal atrophy     Patient Active Problem List   Diagnosis Date Noted   Hot flashes 01/25/2021   Fatigue 08/04/2018   Rash and nonspecific skin eruption 08/13/2016   Chronic right shoulder pain 01/09/2016   Cystocele, midline 10/18/2015   Anxiety and depression 09/27/2015   Borderline diabetes 03/29/2015   Vitamin D deficiency 03/29/2015   Rhinitis,  allergic 03/29/2015   Preventative health care 03/29/2015   Low back pain 03/29/2015   Cystocele 03/08/2015   Increased BMI 03/08/2015   Vaginal atrophy 03/08/2015   Recurrent UTI 03/08/2015   Unstable bladder 03/08/2015   Essential hypertension 08/25/2014   Migraines 08/25/2014   Plantar fasciitis 08/25/2014    Past Surgical History:  Procedure Laterality Date   BREAST BIOPSY Left 1987   neg   BREAST EXCISIONAL BIOPSY     DILATION AND CURETTAGE OF UTERUS  2014   LASIK Bilateral    TUBAL LIGATION      OB History     Gravida  3   Para  3   Term  3   Preterm      AB      Living  3      SAB      IAB      Ectopic      Multiple      Live Births  3            Home Medications    Prior to Admission medications   Medication Sig Start Date End Date Taking? Authorizing Provider  fluticasone (FLONASE) 50 MCG/ACT nasal spray Place 1 spray into both nostrils daily. 02/05/22  Yes Melynda Ripple, NP  predniSONE (DELTASONE) 20 MG tablet Take 2 tablets (40 mg total) by mouth  daily with breakfast for 5 days. 02/05/22 02/10/22 Yes Melynda Ripple, NP  SUMAtriptan (IMITREX) 100 MG tablet Take 1 tablet (100 mg total) by mouth once for 1 dose. May repeat in 2 hours if headache persists or recurs. 02/05/22 02/05/22 Yes Melynda Ripple, NP  amoxicillin-clavulanate (AUGMENTIN) 875-125 MG tablet     [provider]  azelastine (ASTELIN) 0.1 % nasal spray Place into both nostrils 2 (two) times daily. Use in each nostril as directed    [provider]  cetirizine (ZYRTEC) 10 MG tablet TAKE 1 TABLET (10 MG TOTAL) BY MOUTH DAILY. 03/22/16   Pleas Koch, NP  Cholecalciferol (D3 ADULT PO) Take 1,000 Units by mouth daily.    [provider]  estradiol-norethindrone (ACTIVELLA) 1-0.5 MG tablet Take 1 tablet by mouth daily. 05/15/21 05/06/22  Harlin Heys, MD  lisinopril (ZESTRIL) 20 MG tablet TAKE ONE TABLET BY MOUTH DAILY FOR BLOOD PRESSURE 07/13/21   Pleas Koch, NP  Multiple Vitamin (MULTIVITAMIN) tablet Take 1 tablet by mouth daily.    [provider]  sertraline (ZOLOFT) 50 MG tablet Take 1 tablet (50 mg total) by mouth daily. for anxiety and depression. 07/26/21   Pleas Koch, NP    Family History Family History  Problem Relation Age of Onset   Alcohol abuse Mother    Diabetes Mother    Alcohol abuse Father    Diabetes Father    Drug abuse Brother    Hypertension Brother    Diabetes Brother    COPD Maternal Grandmother    Cancer Neg Hx    Breast cancer Neg Hx     Social History Social History   Tobacco Use   Smoking status: Never   Smokeless tobacco: Never  Vaping Use   Vaping Use: Never used  Substance Use Topics   Alcohol use: No    Alcohol/week: 0.0 standard drinks of alcohol   Drug use: No     Allergies   Patient has no known allergies.   Review of Systems Review of Systems  HENT:  Positive for congestion and ear pain.   Neurological:  Positive for headaches.     Physical Exam Triage Vital Signs ED Triage Vitals  Enc Vitals Group     BP 02/05/22 0939 (!) 149/70     Pulse Rate 02/05/22 0939 81     Resp 02/05/22 0939 18     Temp 02/05/22 0939 98.8 F (37.1 C)     Temp Source 02/05/22 0939 Oral     SpO2 02/05/22 0939 94 %     Weight --      Height --      Head Circumference --      Peak Flow --      Pain Score 02/05/22 0945 5     Pain Loc --      Pain Edu? --      Excl. in [place]? --    No data found.  Updated Vital Signs BP (!) 149/70 (BP Location: Left Arm)   Pulse 81   Temp 98.8 F (37.1 C) (Oral)   Resp 18   SpO2 94%   Visual Acuity Right Eye Distance:   Left Eye Distance:   Bilateral Distance:    Right Eye Near:   Left Eye Near:    Bilateral Near:     Physical Exam Vitals and nursing note reviewed.  Constitutional:      General: Meagan Johnson is not in acute distress.  Appearance: Meagan Johnson is well-developed. Meagan Johnson is not ill-appearing.  HENT:     Head: Normocephalic  and atraumatic.     Right Ear: Ear canal normal. A middle ear effusion is present. Tympanic membrane is not erythematous.     Left Ear: Ear canal normal. A middle ear effusion is present. Tympanic membrane is not erythematous.     Nose: Congestion present.     Right Turbinates: Pale. Not enlarged or swollen.     Left Turbinates: Pale. Not enlarged or swollen.     Right Sinus: Maxillary sinus tenderness present.     Left Sinus: Maxillary sinus tenderness present.     Mouth/Throat:     Mouth: Mucous membranes are moist.     Pharynx: Uvula midline. No oropharyngeal exudate or posterior oropharyngeal erythema.     Tonsils: No tonsillar exudate or tonsillar abscesses.  Eyes:     Conjunctiva/sclera: Conjunctivae normal.     Pupils: Pupils are equal, round, and reactive to light.  Cardiovascular:     Rate and Rhythm: Normal rate and regular rhythm.     Heart sounds: Normal heart sounds.  Pulmonary:     Effort: Pulmonary effort is normal.     Breath sounds: Normal breath sounds.  Musculoskeletal:     Cervical back: Normal range of motion and neck supple.  Lymphadenopathy:     Cervical: No cervical adenopathy.  Skin:    General: Skin is warm and dry.  Neurological:     General: No focal deficit present.     Mental Status: Meagan Johnson is alert and oriented to person, place, and time.  Psychiatric:        Mood and Affect: Mood normal.        Behavior: Behavior normal.      UC Treatments / Results  Labs (all labs ordered are listed, but only abnormal results are displayed) Labs Reviewed - No data to display  EKG   Radiology CT Angio Head W or Wo Contrast  Result Date: 02/03/2022 CLINICAL DATA:  Initial evaluation for subarachnoid hemorrhage. EXAM: CT ANGIOGRAPHY HEAD TECHNIQUE: Multidetector CT imaging of the head was performed using the standard protocol during bolus administration of intravenous contrast. Multiplanar CT image reconstructions and MIPs were obtained to evaluate the  vascular anatomy. RADIATION DOSE REDUCTION: This exam was performed according to the departmental dose-optimization program which includes automated exposure control, adjustment of the mA and/or kV according to patient size and/or use of iterative reconstruction technique. CONTRAST:  50mL OMNIPAQUE IOHEXOL 350 MG/ML SOLN COMPARISON:  None Available. FINDINGS: CT HEAD Brain: Cerebral volume within normal limits for patient age. No evidence for acute intracranial hemorrhage. No findings to suggest acute large vessel territory infarct. No mass lesion, midline shift, or mass effect. Ventricles are normal in size without evidence for hydrocephalus. No extra-axial fluid collection identified. Vascular: No hyperdense vessel identified. Skull: Scalp soft tissues demonstrate no acute abnormality. Calvarium intact. Sinuses/Orbits: Globes and orbital soft tissues within normal limits. Visualized paranasal sinuses are clear. No mastoid effusion. CTA HEAD Anterior circulation: Mild atheromatous change within the carotid siphons without stenosis. A1 segments patent bilaterally. Normal anterior communicating complex. Anterior cerebral arteries patent without stenosis. No M1 stenosis or occlusion. No proximal MCA branch occlusion or high-grade stenosis. Distal MCA branches perfused and symmetric. Posterior circulation: Both vertebral arteries patent without stenosis. Neither PICA origin well visualized. Basilar widely patent without stenosis. Superior cerebellar and posterior cerebral arteries patent bilaterally. Venous sinuses: Grossly patent allowing for timing the contrast bolus. Anatomic variants:  None significant. No intracranial aneurysm or other vascular malformation. Review of the MIP images confirms the above findings. IMPRESSION: 1. Normal CTA of the head. No large vessel occlusion, hemodynamically significant stenosis, or other acute vascular abnormality. No intracranial aneurysm. 2. No other acute intracranial  abnormality. Electronically Signed   By: Rise Mu M.D.   On: 02/03/2022 22:36    Procedures Procedures (including critical care time)  Medications Ordered in UC Medications - No data to display  Initial Impression / Assessment and Plan / UC Course  I have reviewed the triage vital signs and the nursing notes.  Pertinent labs & imaging results that were available during my care of the patient were reviewed by me and considered in my medical decision making (see chart for details).     Reviewed exam and symptoms with patient.  No red flags on exam. Will start Flonase and prednisone for eustachian tube dysfunction Trial of sumatriptan for migraine.  2 doses only provided.  Side effect profile reviewed.  Meagan Johnson was instructed not to take the prednisone and the sumatriptan together. Continue Augmentin as prescribed Meagan Johnson has been on Zyrtec for several years advised to switch to the Claritin or Allegra Continue nasal rinses as tolerated Rest Fluids Follow-up with PCP in 2 to 3 days for recheck ER precautions reviewed and patient verbalized understanding Final Clinical Impressions(s) / UC Diagnoses   Final diagnoses:  Intractable migraine with status migrainosus, unspecified migraine type  Sinus pressure  Eustachian tube dysfunction, bilateral     Discharge Instructions      Flonase daily Over-the-counter allergy medicine such as Claritin or Allegra as you been on Zyrtec for some time Continue Augmentin as previously prescribed Trial of Imitrex for migraine.  May repeat dose in 2 hours if your symptoms persist Prednisone daily Continue nasal rinses Rest and fluids Follow-up with your PCP 2 to 3 days for recheck Please go the emergency room if you have any worsening symptoms   ED Prescriptions     Medication Sig Dispense Auth. Provider   fluticasone (FLONASE) 50 MCG/ACT nasal spray Place 1 spray into both nostrils daily. 15.8 mL Radford Pax, NP   predniSONE  (DELTASONE) 20 MG tablet Take 2 tablets (40 mg total) by mouth daily with breakfast for 5 days. 10 tablet Radford Pax, NP   SUMAtriptan (IMITREX) 100 MG tablet Take 1 tablet (100 mg total) by mouth once for 1 dose. May repeat in 2 hours if headache persists or recurs. 2 tablet Radford Pax, NP      PDMP not reviewed this encounter.   Radford Pax, NP 02/05/22 1011

## 2022-02-26 ENCOUNTER — Ambulatory Visit
Admission: RE | Admit: 2022-02-26 | Discharge: 2022-02-26 | Disposition: A | Discharge status: Home / Self Care | Payer: BC Managed Care – PPO | Source: Ambulatory Visit | Attending: Obstetrics and Gynecology | Admitting: Obstetrics and Gynecology

## 2022-02-26 DIAGNOSIS — N63 Unspecified lump in unspecified breast: Secondary | ICD-10-CM

## 2022-02-26 DIAGNOSIS — N6002 Solitary cyst of left breast: Secondary | ICD-10-CM | POA: Diagnosis not present

## 2022-02-26 DIAGNOSIS — N6321 Unspecified lump in the left breast, upper outer quadrant: Secondary | ICD-10-CM | POA: Diagnosis not present

## 2022-03-30 IMAGING — MG MM DIGITAL SCREENING BILAT W/ TOMO AND CAD
8 series · 8 of 24 positions shown · non-contrast
Comparison: Previous exam(s).

ACR Breast Density Category a: The breast tissue is almost entirely
fatty.

CLINICAL DATA: Screening.

EXAM:
DIGITAL SCREENING BILATERAL MAMMOGRAM WITH TOMOSYNTHESIS AND CAD
TECHNIQUE: Bilateral screening digital craniocaudal and mediolateral oblique
mammograms were obtained. Bilateral screening digital breast
tomosynthesis was performed. The images were evaluated with
computer-aided detection.

[L CC synth-2D]
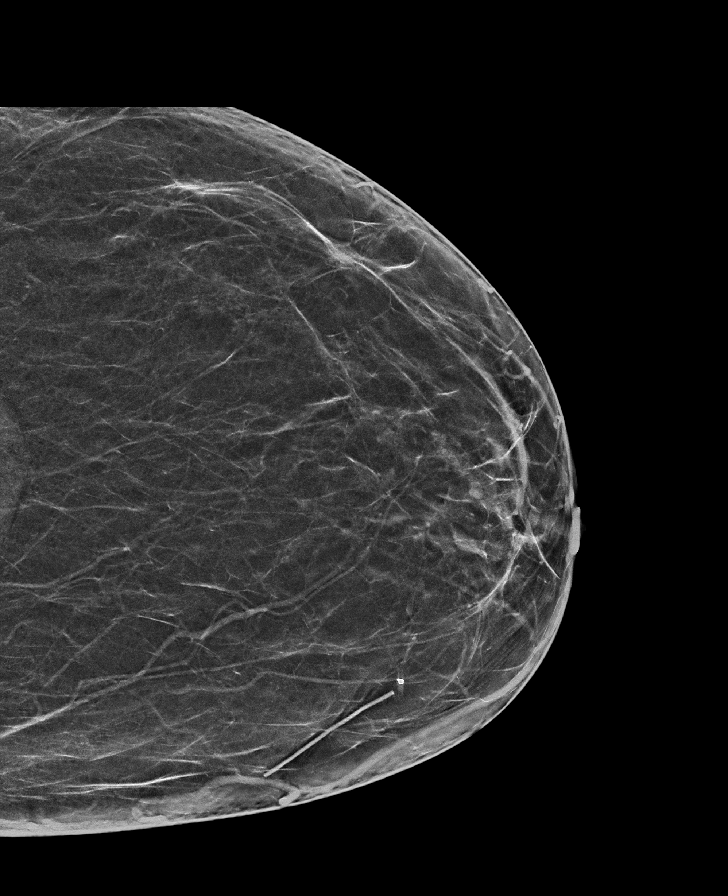

[R MLO synth-2D]
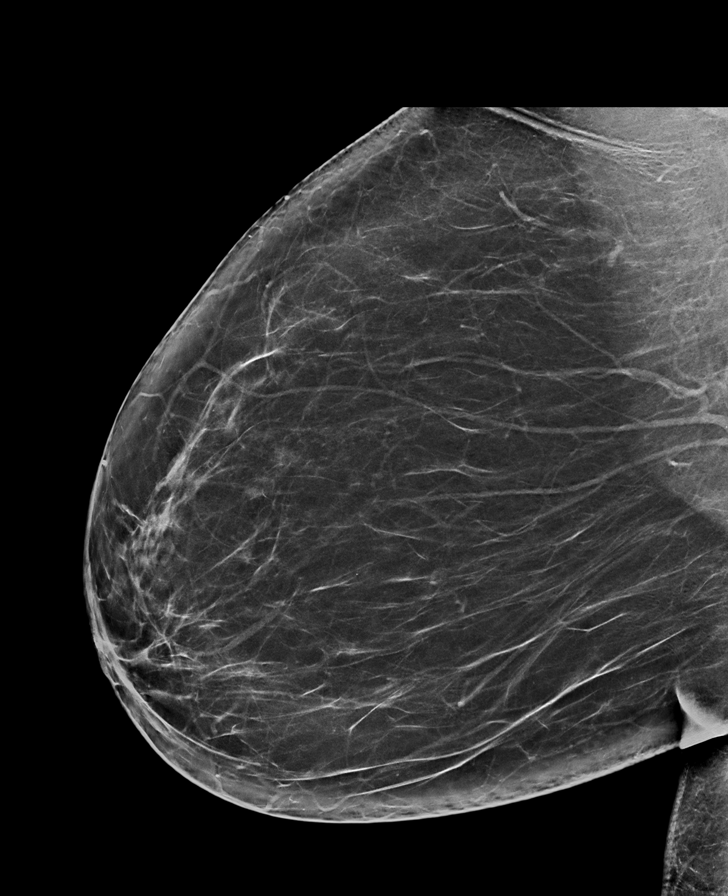

[R CC synth-2D]
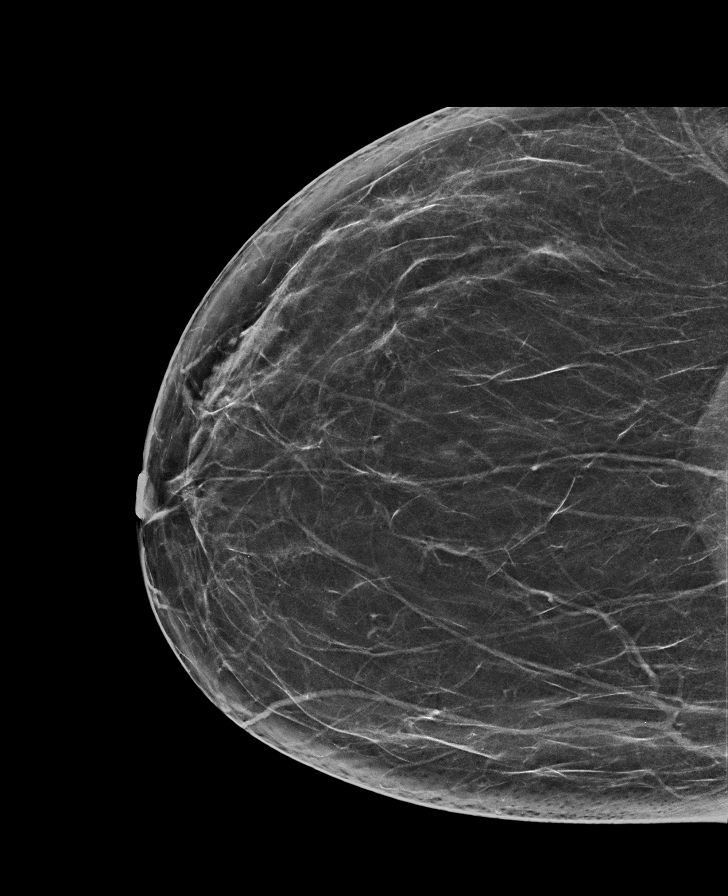

[L MLO synth-2D]
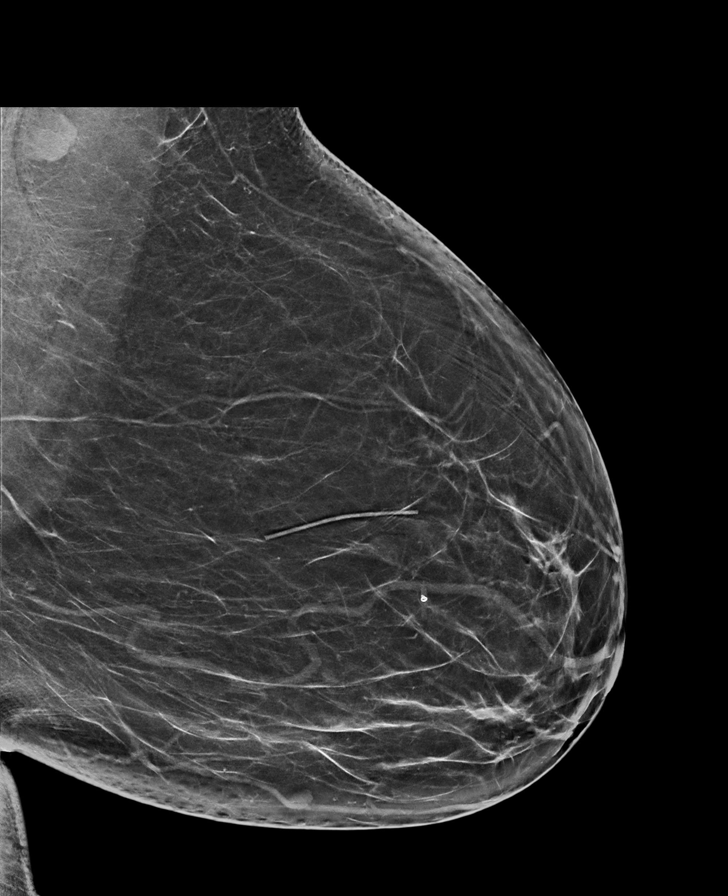

[R CC tomo · tomo slice 36/71.0]
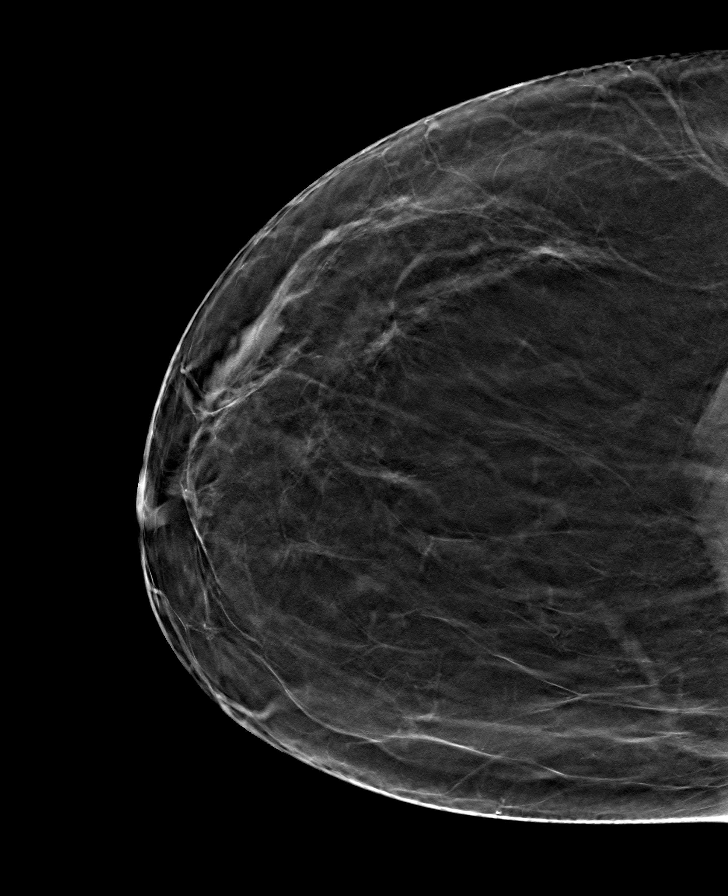

[L MLO tomo · tomo slice 39/76.0]
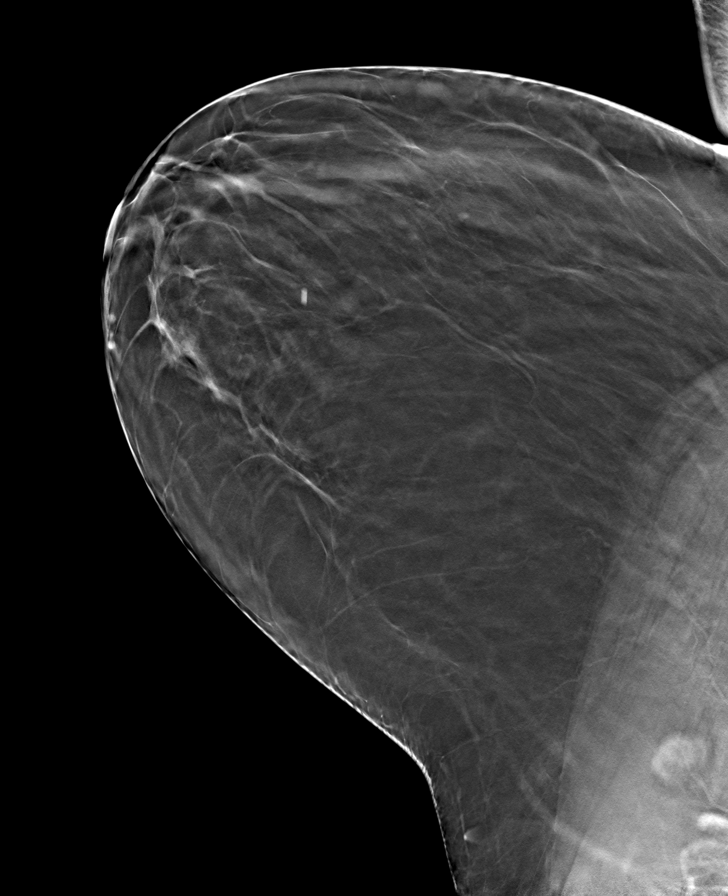

[L CC tomo · tomo slice 34/67.0]
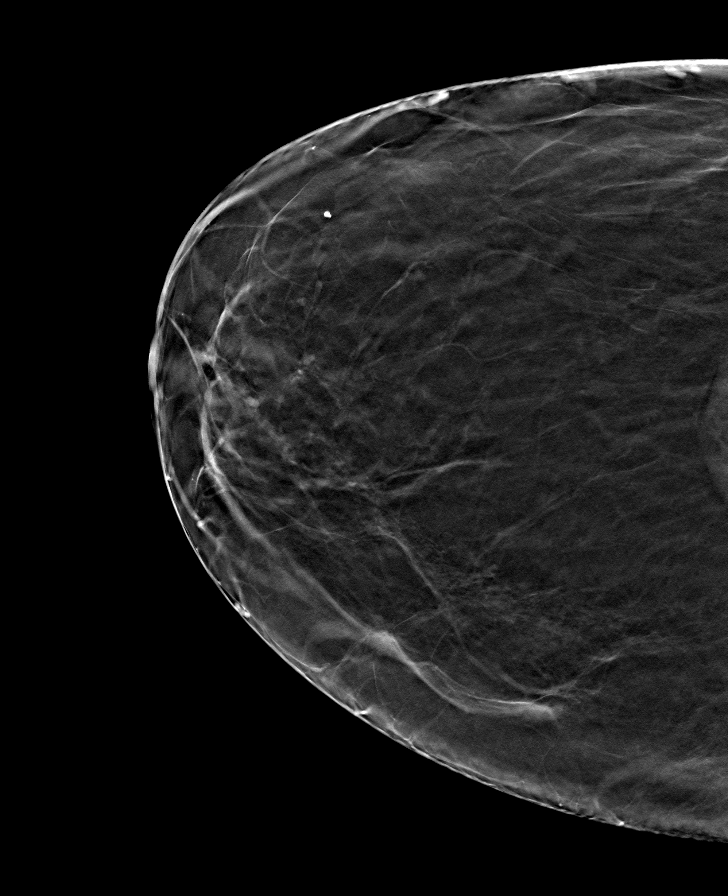

[R MLO tomo · tomo slice 39/78.0]
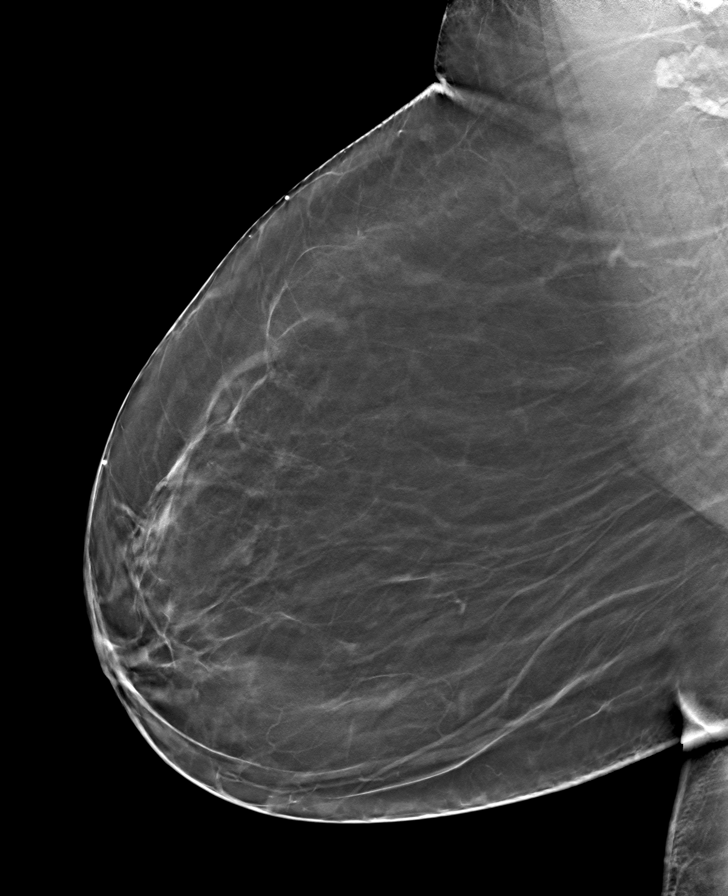

[8 of 24 positions shown; findings below may reference images not displayed]

FINDINGS: There are no findings suspicious for malignancy. The images were
evaluated with computer-aided detection.
IMPRESSION: No mammographic evidence of malignancy. A result letter of this
screening mammogram will be mailed directly to the patient.

RECOMMENDATION:
Screening mammogram in one year. (Code:[id number])

BI-RADS CATEGORY  1: Negative.

## 2022-04-09 ENCOUNTER — Other Ambulatory Visit: Payer: Self-pay | Admitting: Primary Care

## 2022-04-09 DIAGNOSIS — I10 Essential (primary) hypertension: Secondary | ICD-10-CM

## 2022-04-09 NOTE — Telephone Encounter (Signed)
Patient stated that she is currently in the process of moving. She stated she will call back once she is moved and settled in.

## 2022-04-09 NOTE — Telephone Encounter (Signed)
Patient is due for follow up in mid to late May, this will be required prior to any further refills.  Please schedule, thank you!

## 2022-04-20 ENCOUNTER — Other Ambulatory Visit: Payer: Self-pay | Admitting: Primary Care

## 2022-04-20 ENCOUNTER — Other Ambulatory Visit: Payer: Self-pay | Admitting: Obstetrics and Gynecology

## 2022-04-20 DIAGNOSIS — F32A Depression, unspecified: Secondary | ICD-10-CM

## 2022-04-20 DIAGNOSIS — N951 Menopausal and female climacteric states: Secondary | ICD-10-CM

## 2022-04-21 NOTE — Telephone Encounter (Signed)
Patient is due for CPE/follow up in late May, this will be required prior to any further refills.  Please schedule, thank you!   

## 2022-04-22 NOTE — Telephone Encounter (Signed)
Called patient to schedule CPE, stated she is moving to [place] but is going to keep her healthcare in this state. She is unsure when she will be able to come for an appt in May. Stated she will call back when she has a date available.

## 2022-07-10 ENCOUNTER — Telehealth: Payer: Self-pay

## 2022-07-10 NOTE — Telephone Encounter (Signed)
Pt calling requesting [facility] to put in an order for her to have her 26m f/u mammogram in August so she can get it scheduled.  [phone]

## 2022-07-12 ENCOUNTER — Other Ambulatory Visit: Payer: Self-pay | Admitting: Primary Care

## 2022-07-12 ENCOUNTER — Other Ambulatory Visit: Payer: Self-pay | Admitting: Obstetrics and Gynecology

## 2022-07-12 DIAGNOSIS — N63 Unspecified lump in unspecified breast: Secondary | ICD-10-CM

## 2022-07-12 DIAGNOSIS — I10 Essential (primary) hypertension: Secondary | ICD-10-CM

## 2022-07-14 ENCOUNTER — Other Ambulatory Visit: Payer: Self-pay | Admitting: Obstetrics and Gynecology

## 2022-07-14 DIAGNOSIS — N951 Menopausal and female climacteric states: Secondary | ICD-10-CM

## 2022-07-15 NOTE — Telephone Encounter (Signed)
Ordered 07/12/22

## 2022-07-19 ENCOUNTER — Encounter: Payer: Self-pay | Admitting: Primary Care

## 2022-07-19 ENCOUNTER — Ambulatory Visit (INDEPENDENT_AMBULATORY_CARE_PROVIDER_SITE_OTHER): Payer: BC Managed Care – PPO | Admitting: Primary Care

## 2022-07-19 VITALS — BP 138/76 | HR 80 | Temp 97.6°F | Ht 66.0 in | Wt 210.0 lb

## 2022-07-19 DIAGNOSIS — I10 Essential (primary) hypertension: Secondary | ICD-10-CM

## 2022-07-19 DIAGNOSIS — R7303 Prediabetes: Secondary | ICD-10-CM

## 2022-07-19 DIAGNOSIS — R232 Flushing: Secondary | ICD-10-CM | POA: Diagnosis not present

## 2022-07-19 DIAGNOSIS — F419 Anxiety disorder, unspecified: Secondary | ICD-10-CM

## 2022-07-19 DIAGNOSIS — G43009 Migraine without aura, not intractable, without status migrainosus: Secondary | ICD-10-CM | POA: Diagnosis not present

## 2022-07-19 DIAGNOSIS — R002 Palpitations: Secondary | ICD-10-CM

## 2022-07-19 DIAGNOSIS — Z0000 Encounter for general adult medical examination without abnormal findings: Secondary | ICD-10-CM | POA: Diagnosis not present

## 2022-07-19 DIAGNOSIS — F32A Depression, unspecified: Secondary | ICD-10-CM

## 2022-07-19 LAB — COMPREHENSIVE METABOLIC PANEL
ALT: 10 U/L (ref 0–35)
AST: 14 U/L (ref 0–37)
Albumin: 4 g/dL (ref 3.5–5.2)
Alkaline Phosphatase: 49 U/L (ref 39–117)
BUN: 14 mg/dL (ref 6–23)
CO2: 28 mEq/L (ref 19–32)
Calcium: 9.6 mg/dL (ref 8.4–10.5)
Chloride: 107 mEq/L (ref 96–112)
Creatinine, Ser: 0.84 mg/dL (ref 0.40–1.20)
GFR: 73.8 mL/min (ref >60.00)
Glucose, Bld: 93 mg/dL (ref 70–99)
Potassium: 4 mEq/L (ref 3.5–5.1)
Sodium: 140 mEq/L (ref 135–145)
Total Bilirubin: 0.6 mg/dL (ref 0.2–1.2)
Total Protein: 6.9 g/dL (ref 6.0–8.3)

## 2022-07-19 LAB — CBC
HCT: 42.8 % (ref 36.0–46.0)
Hemoglobin: 14.1 g/dL (ref 12.0–15.0)
MCHC: 33.1 g/dL (ref 30.0–36.0)
MCV: 92.5 fl (ref 78.0–100.0)
Platelets: 202 10*3/uL (ref 150.0–400.0)
RBC: 4.62 Mil/uL (ref 3.87–5.11)
RDW: 13.5 % (ref 11.5–15.5)
WBC: 7.4 10*3/uL (ref 4.0–10.5)

## 2022-07-19 LAB — LIPID PANEL
Cholesterol: 174 mg/dL (ref 0–200)
HDL: 36.8 mg/dL — ABNORMAL LOW (ref >39.00)
LDL Cholesterol: 117 mg/dL — ABNORMAL HIGH (ref 0–99)
NonHDL: 136.85
Total CHOL/HDL Ratio: 5
Triglycerides: 99 mg/dL (ref 0.0–149.0)
VLDL: 19.8 mg/dL (ref 0.0–40.0)

## 2022-07-19 LAB — HEMOGLOBIN A1C: Hgb A1c MFr Bld: 5.7 % (ref 4.6–6.5)

## 2022-07-19 LAB — TSH: TSH: 2.1 u[IU]/mL (ref 0.35–5.50)

## 2022-07-19 MED ORDER — SERTRALINE HCL 50 MG PO TABS: 50.0000 mg | ORAL_TABLET | Freq: Every day | ORAL | 3 refills | Status: AC

## 2022-07-19 MED ORDER — LISINOPRIL 20 MG PO TABS: ORAL_TABLET | ORAL | 3 refills | Status: AC

## 2022-07-19 NOTE — Assessment & Plan Note (Signed)
Controlled.  Continue lisinopril 20 mg daily.  CMP pending. 

## 2022-07-19 NOTE — Assessment & Plan Note (Signed)
Controlled.  Continue Zoloft 50 mg daily.  

## 2022-07-19 NOTE — Assessment & Plan Note (Addendum)
Controlled.  No concerns today. Continue to monitor.   Reviewed CTA head from January 2024.  Continue sumatriptan 100 mg PRN

## 2022-07-19 NOTE — Progress Notes (Signed)
Subjective:    Patient ID: Meagan Johnson, female    DOB: [birth year], 62 y.o.   MRN: [id number]  HPI  Meagan Johnson is a very pleasant 62 y.o. female who presents today for complete physical and follow up of chronic conditions.  She would also like to mention intermittent palpitations. Chronic for the last few years. Symptoms occur with rest and exertion lasting minutes. A few times she's experienced muscle tightness to her mid thoracic back. She denies diaphoresis, nausea. She has a family history of heart conduction problems in her son, her granddaughter, and her brother. She has never seen cardiology.  She has undergone several EKGs in the past, all of which were normal.  Immunizations: -Tetanus: Completed in 2014 -Shingles: Never completed, declines   Diet: Fair diet.  Exercise: Exercising 4-5 days weekly   Eye exam: Completes annually  Dental exam: Completes semi-annually    Pap Smear: Follows with GYN, up-to-date Mammogram: Scheduled for August 2024  Colonoscopy: Completed Cologuard in 2022, negative.   BP Readings from Last 3 Encounters:  07/19/22 138/76  02/05/22 (!) 149/70  02/03/22 (!) 149/70      Review of Systems  Constitutional:  Negative for unexpected weight change.  HENT:  Negative for rhinorrhea.   Respiratory:  Negative for cough and shortness of breath.   Cardiovascular:  Positive for palpitations. Negative for chest pain.  Gastrointestinal:  Negative for constipation and diarrhea.  Genitourinary:  Negative for difficulty urinating.  Musculoskeletal:  Negative for arthralgias and myalgias.  Skin:  Negative for rash.  Allergic/Immunologic: Negative for environmental allergies.  Neurological:  Negative for dizziness, numbness and headaches.  Psychiatric/Behavioral:  The patient is not nervous/anxious.          Past Medical History:  Diagnosis Date   Allergy    Frequent bowel movements 01/25/2021   Hypertension    Migraine     Postmenopausal bleeding 2014   Vaginal atrophy     Social History   Socioeconomic History   Marital status: Widowed    Spouse name: Not on file   Number of children: Not on file   Years of education: Not on file   Highest education level: Not on file  Occupational History   Not on file  Tobacco Use   Smoking status: Never   Smokeless tobacco: Never  Vaping Use   Vaping status: Never Used  Substance and Sexual Activity   Alcohol use: No    Alcohol/week: 0.0 standard drinks of alcohol   Drug use: No   Sexual activity: Yes    Birth control/protection: Post-menopausal  Other Topics Concern   Not on file  Social History Narrative   Not on file   Social Determinants of Health   Financial Resource Strain: Not on file  Food Insecurity: Not on file  Transportation Needs: Not on file  Physical Activity: Not on file  Stress: Not on file  Social Connections: Not on file  Intimate Partner Violence: Not on file    Past Surgical History:  Procedure Laterality Date   BREAST BIOPSY Left 1987   neg   BREAST EXCISIONAL BIOPSY     DILATION AND CURETTAGE OF UTERUS  2014   LASIK Bilateral    TUBAL LIGATION      Family History  Problem Relation Age of Onset   Alcohol abuse Mother    Diabetes Mother    Alcohol abuse Father    Diabetes Father    Drug abuse Brother    Hypertension Brother  Diabetes Brother    COPD Maternal Grandmother    Cancer Neg Hx    Breast cancer Neg Hx     No Known Allergies  Current Outpatient Medications on File Prior to Visit  Medication Sig Dispense Refill   cetirizine (ZYRTEC) 10 MG tablet TAKE 1 TABLET (10 MG TOTAL) BY MOUTH DAILY. 90 tablet 1   Cholecalciferol (D3 ADULT PO) Take 1,000 Units by mouth daily.     estradiol-norethindrone (ACTIVELLA) 1-0.5 MG tablet TAKE 1 TABLET BY MOUTH DAILY 84 tablet 3   fluticasone (FLONASE) 50 MCG/ACT nasal spray Place 1 spray into both nostrils daily. 15.8 mL 0   Multiple Vitamin (MULTIVITAMIN) tablet  Take 1 tablet by mouth daily.     azelastine (ASTELIN) 0.1 % nasal spray Place into both nostrils 2 (two) times daily. Use in each nostril as directed (Patient not taking: Reported on 07/19/2022)     No current facility-administered medications on file prior to visit.    BP 138/76   Pulse 80   Temp 97.6 F (36.4 C) (Temporal)   Ht 5\' 6"  (1.676 m)   Wt 210 lb (95.3 kg)   SpO2 98%   BMI 33.89 kg/m  Objective:   Physical Exam HENT:     Right Ear: Tympanic membrane and ear canal normal.     Left Ear: Tympanic membrane and ear canal normal.     Nose: Nose normal.  Eyes:     Conjunctiva/sclera: Conjunctivae normal.     Pupils: Pupils are equal, round, and reactive to light.  Neck:     Thyroid: No thyromegaly.  Cardiovascular:     Rate and Rhythm: Normal rate and regular rhythm.     Heart sounds: No murmur heard. Pulmonary:     Effort: Pulmonary effort is normal.     Breath sounds: Normal breath sounds. No rales.  Abdominal:     General: Bowel sounds are normal.     Palpations: Abdomen is soft.     Tenderness: There is no abdominal tenderness.  Musculoskeletal:        General: Normal range of motion.     Cervical back: Neck supple.  Lymphadenopathy:     Cervical: No cervical adenopathy.  Skin:    General: Skin is warm and dry.     Findings: No rash.  Neurological:     Mental Status: She is alert and oriented to person, place, and time.     Cranial Nerves: No cranial nerve deficit.     Deep Tendon Reflexes: Reflexes are normal and symmetric.  Psychiatric:        Mood and Affect: Mood normal.           Assessment & Plan:  Preventative health care Assessment & Plan: Tetanus due, provided today. declines Shingrix vaccines. Pap smear UTD. Mammogram up-to-date and also scheduled Colon cancer screening up-to-date, due next year.  Discussed the importance of a healthy diet and regular exercise in order for weight loss, and to reduce the risk of further  co-morbidity.  Exam stable. Labs pending.  Follow up in 1 year for repeat physical.    Essential hypertension Assessment & Plan: Controlled.  Continue lisinopril 20 mg daily.  CMP pending.  Orders: -     Lisinopril; TAKE ONE TABLET BY MOUTH DAILY FOR BLOOD PRESSURE  Dispense: 90 tablet; Refill: 3 -     CBC -     Comprehensive metabolic panel -     TSH -     Lipid panel  Hot flashes Assessment & Plan: Significant improvement.   Continue Activella 1-0.5 mg. Following with GYN   Migraine without aura and without status migrainosus, not intractable Assessment & Plan: Controlled.  No concerns today. Continue to monitor.   Reviewed CTA head from January 2024.  Continue sumatriptan 100 mg PRN   Anxiety and depression Assessment & Plan: Controlled.   Continue Zoloft 50 mg daily.    Orders: -     Sertraline HCl; Take 1 tablet (50 mg total) by mouth daily. for anxiety and depression.  Dispense: 90 tablet; Refill: 3  Borderline diabetes Assessment & Plan: Repeat A1C pending.    Orders: -     Hemoglobin A1c -     Lipid panel  Palpitations Assessment & Plan: Chronic.  Declines further workup at this time including EKG, Holter monitor, cardiology evaluation. She will think about this and update if she changes her mind.  Checking labs today including TSH, CBC.  Orders: -     CBC -     Comprehensive metabolic panel -     TSH        Doreene Nest, NP

## 2022-07-19 NOTE — Assessment & Plan Note (Signed)
Repeat A1C pending. 

## 2022-07-19 NOTE — Assessment & Plan Note (Signed)
Significant improvement.   Continue Activella 1-0.5 mg. Following with GYN

## 2022-07-19 NOTE — Assessment & Plan Note (Signed)
Chronic.  Declines further workup at this time including EKG, Holter monitor, cardiology evaluation. She will think about this and update if she changes her mind.  Checking labs today including TSH, CBC.

## 2022-07-19 NOTE — Patient Instructions (Signed)
Stop by the lab prior to leaving today. I will notify you of your results once received.   It was a pleasure to see you today!  

## 2022-07-19 NOTE — Assessment & Plan Note (Signed)
Tetanus due, provided today. declines Shingrix vaccines. Pap smear UTD. Mammogram up-to-date and also scheduled Colon cancer screening up-to-date, due next year.  Discussed the importance of a healthy diet and regular exercise in order for weight loss, and to reduce the risk of further co-morbidity.  Exam stable. Labs pending.  Follow up in 1 year for repeat physical.

## 2022-07-20 ENCOUNTER — Other Ambulatory Visit: Payer: Self-pay | Admitting: Primary Care

## 2022-07-20 DIAGNOSIS — F32A Depression, unspecified: Secondary | ICD-10-CM

## 2022-07-21 DIAGNOSIS — R002 Palpitations: Secondary | ICD-10-CM

## 2022-07-22 ENCOUNTER — Ambulatory Visit: Payer: BC Managed Care – PPO | Attending: Primary Care

## 2022-07-22 DIAGNOSIS — R002 Palpitations: Secondary | ICD-10-CM

## 2022-07-27 DIAGNOSIS — R002 Palpitations: Secondary | ICD-10-CM | POA: Diagnosis not present

## 2022-08-27 ENCOUNTER — Ambulatory Visit
Admission: RE | Admit: 2022-08-27 | Discharge: 2022-08-27 | Disposition: A | Discharge status: Home / Self Care | Payer: BC Managed Care – PPO | Source: Ambulatory Visit | Attending: Obstetrics and Gynecology | Admitting: Obstetrics and Gynecology

## 2022-08-27 DIAGNOSIS — N63 Unspecified lump in unspecified breast: Secondary | ICD-10-CM | POA: Diagnosis not present

## 2023-07-29 ENCOUNTER — Other Ambulatory Visit: Payer: Self-pay | Admitting: Obstetrics and Gynecology

## 2023-07-29 DIAGNOSIS — N951 Menopausal and female climacteric states: Secondary | ICD-10-CM
